# Patient Record
Sex: Female | Born: 1955
Health system: Southern US, Community
[De-identification: ages and names within clinical notes are randomized; demographics above are authoritative.]

## PROBLEM LIST (undated history)

## (undated) DIAGNOSIS — N852 Hypertrophy of uterus: Secondary | ICD-10-CM

## (undated) DIAGNOSIS — I10 Essential (primary) hypertension: Secondary | ICD-10-CM

## (undated) DIAGNOSIS — M1990 Unspecified osteoarthritis, unspecified site: Secondary | ICD-10-CM

## (undated) DIAGNOSIS — M8580 Other specified disorders of bone density and structure, unspecified site: Secondary | ICD-10-CM

## (undated) DIAGNOSIS — C801 Malignant (primary) neoplasm, unspecified: Secondary | ICD-10-CM

## (undated) DIAGNOSIS — E079 Disorder of thyroid, unspecified: Secondary | ICD-10-CM

## (undated) HISTORY — PX: THYROID LOBECTOMY: SHX420

## (undated) HISTORY — DX: Essential (primary) hypertension: I10

## (undated) HISTORY — DX: Other specified disorders of bone density and structure, unspecified site: M85.80

## (undated) HISTORY — DX: Malignant (primary) neoplasm, unspecified: C80.1

## (undated) HISTORY — DX: Unspecified osteoarthritis, unspecified site: M19.90

## (undated) HISTORY — DX: Hypertrophy of uterus: N85.2

## (undated) HISTORY — DX: Disorder of thyroid, unspecified: E07.9

---

## 2001-07-24 ENCOUNTER — Other Ambulatory Visit: Admission: RE | Admit: 2001-07-24 | Discharge: 2001-07-24 | Payer: Self-pay | Admitting: Obstetrics and Gynecology

## 2001-08-04 ENCOUNTER — Encounter (HOSPITAL_BASED_OUTPATIENT_CLINIC_OR_DEPARTMENT_OTHER): Payer: Self-pay | Admitting: General Surgery

## 2001-08-04 ENCOUNTER — Ambulatory Visit (HOSPITAL_COMMUNITY): Admission: RE | Admit: 2001-08-04 | Discharge: 2001-08-04 | Payer: Self-pay | Admitting: General Surgery

## 2001-08-07 ENCOUNTER — Encounter (HOSPITAL_BASED_OUTPATIENT_CLINIC_OR_DEPARTMENT_OTHER): Payer: Self-pay | Admitting: General Surgery

## 2001-08-07 ENCOUNTER — Ambulatory Visit (HOSPITAL_COMMUNITY): Admission: RE | Admit: 2001-08-07 | Discharge: 2001-08-07 | Payer: Self-pay | Admitting: General Surgery

## 2001-08-07 ENCOUNTER — Encounter (INDEPENDENT_AMBULATORY_CARE_PROVIDER_SITE_OTHER): Payer: Self-pay | Admitting: Specialist

## 2001-10-23 ENCOUNTER — Encounter (HOSPITAL_BASED_OUTPATIENT_CLINIC_OR_DEPARTMENT_OTHER): Payer: Self-pay | Admitting: General Surgery

## 2001-10-23 ENCOUNTER — Ambulatory Visit (HOSPITAL_COMMUNITY): Admission: RE | Admit: 2001-10-23 | Discharge: 2001-10-23 | Payer: Self-pay | Admitting: General Surgery

## 2001-10-23 ENCOUNTER — Encounter (INDEPENDENT_AMBULATORY_CARE_PROVIDER_SITE_OTHER): Payer: Self-pay | Admitting: Specialist

## 2001-11-24 ENCOUNTER — Encounter (HOSPITAL_BASED_OUTPATIENT_CLINIC_OR_DEPARTMENT_OTHER): Payer: Self-pay | Admitting: General Surgery

## 2001-11-29 ENCOUNTER — Encounter (INDEPENDENT_AMBULATORY_CARE_PROVIDER_SITE_OTHER): Payer: Self-pay | Admitting: Family Medicine

## 2001-11-29 ENCOUNTER — Encounter (INDEPENDENT_AMBULATORY_CARE_PROVIDER_SITE_OTHER): Payer: Self-pay | Admitting: *Deleted

## 2001-11-29 ENCOUNTER — Ambulatory Visit (HOSPITAL_COMMUNITY): Admission: RE | Admit: 2001-11-29 | Discharge: 2001-11-30 | Payer: Self-pay | Admitting: General Surgery

## 2001-11-29 DIAGNOSIS — D34 Benign neoplasm of thyroid gland: Secondary | ICD-10-CM | POA: Insufficient documentation

## 2002-07-30 ENCOUNTER — Other Ambulatory Visit: Admission: RE | Admit: 2002-07-30 | Discharge: 2002-07-30 | Payer: Self-pay | Admitting: Obstetrics and Gynecology

## 2002-09-07 ENCOUNTER — Ambulatory Visit (HOSPITAL_COMMUNITY): Admission: RE | Admit: 2002-09-07 | Discharge: 2002-09-07 | Payer: Self-pay | Admitting: Obstetrics and Gynecology

## 2003-07-27 DIAGNOSIS — I10 Essential (primary) hypertension: Secondary | ICD-10-CM | POA: Insufficient documentation

## 2004-10-21 ENCOUNTER — Encounter: Admission: RE | Admit: 2004-10-21 | Discharge: 2004-10-21 | Payer: Self-pay | Admitting: Internal Medicine

## 2006-06-19 ENCOUNTER — Emergency Department (HOSPITAL_COMMUNITY): Admission: EM | Admit: 2006-06-19 | Discharge: 2006-06-19 | Payer: Self-pay | Admitting: Family Medicine

## 2006-07-06 ENCOUNTER — Emergency Department (HOSPITAL_COMMUNITY): Admission: EM | Admit: 2006-07-06 | Discharge: 2006-07-06 | Payer: Self-pay | Admitting: Emergency Medicine

## 2008-03-10 ENCOUNTER — Emergency Department (HOSPITAL_COMMUNITY): Admission: EM | Admit: 2008-03-10 | Discharge: 2008-03-10 | Payer: Self-pay | Admitting: Family Medicine

## 2008-09-09 ENCOUNTER — Ambulatory Visit: Payer: Self-pay | Admitting: Family Medicine

## 2008-09-13 ENCOUNTER — Encounter (INDEPENDENT_AMBULATORY_CARE_PROVIDER_SITE_OTHER): Payer: Self-pay | Admitting: Family Medicine

## 2008-09-13 LAB — CONVERTED CEMR LAB
ALT: 63 units/L — ABNORMAL HIGH (ref 0–35)
AST: 34 units/L (ref 0–37)
Albumin: 4.8 g/dL (ref 3.5–5.2)
Alkaline Phosphatase: 56 units/L (ref 39–117)
BUN: 18 mg/dL (ref 6–23)
Basophils Absolute: 0 10*3/uL (ref 0.0–0.1)
Basophils Relative: 1 % (ref 0–1)
Calcium: 9.8 mg/dL (ref 8.4–10.5)
Chloride: 99 meq/L (ref 96–112)
Hep B E Ab: NEGATIVE
MCHC: 34 g/dL (ref 30.0–36.0)
Monocytes Relative: 7 % (ref 3–12)
Neutro Abs: 2.3 10*3/uL (ref 1.7–7.7)
Neutrophils Relative %: 52 % (ref 43–77)
Platelets: 274 10*3/uL (ref 150–400)
Potassium: 3.8 meq/L (ref 3.5–5.3)
RBC: 4.68 M/uL (ref 3.87–5.11)
Sodium: 141 meq/L (ref 135–145)
Total Protein: 7.7 g/dL (ref 6.0–8.3)
WBC: 4.3 10*3/uL (ref 4.0–10.5)

## 2008-09-17 ENCOUNTER — Encounter (INDEPENDENT_AMBULATORY_CARE_PROVIDER_SITE_OTHER): Payer: Self-pay | Admitting: Family Medicine

## 2008-12-20 ENCOUNTER — Encounter (INDEPENDENT_AMBULATORY_CARE_PROVIDER_SITE_OTHER): Payer: Self-pay | Admitting: Family Medicine

## 2008-12-20 ENCOUNTER — Ambulatory Visit: Payer: Self-pay | Admitting: Family Medicine

## 2008-12-20 DIAGNOSIS — K921 Melena: Secondary | ICD-10-CM | POA: Insufficient documentation

## 2008-12-20 DIAGNOSIS — E119 Type 2 diabetes mellitus without complications: Secondary | ICD-10-CM

## 2008-12-20 LAB — CONVERTED CEMR LAB
Bilirubin Urine: NEGATIVE
Blood in Urine, dipstick: NEGATIVE
Glucose, Urine, Semiquant: NEGATIVE
Ketones, urine, test strip: NEGATIVE
Nitrite: NEGATIVE
Protein, U semiquant: NEGATIVE
Specific Gravity, Urine: 1.02
Urobilinogen, UA: 0.2
WBC Urine, dipstick: NEGATIVE
pH: 7

## 2008-12-25 ENCOUNTER — Encounter (INDEPENDENT_AMBULATORY_CARE_PROVIDER_SITE_OTHER): Payer: Self-pay | Admitting: Family Medicine

## 2008-12-26 ENCOUNTER — Ambulatory Visit (HOSPITAL_COMMUNITY): Admission: RE | Admit: 2008-12-26 | Discharge: 2008-12-26 | Payer: Self-pay | Admitting: Family Medicine

## 2009-03-20 ENCOUNTER — Encounter (INDEPENDENT_AMBULATORY_CARE_PROVIDER_SITE_OTHER): Payer: Self-pay | Admitting: *Deleted

## 2010-03-05 ENCOUNTER — Ambulatory Visit: Payer: Self-pay | Admitting: Physician Assistant

## 2010-03-05 DIAGNOSIS — N390 Urinary tract infection, site not specified: Secondary | ICD-10-CM | POA: Insufficient documentation

## 2010-03-05 LAB — CONVERTED CEMR LAB
Bilirubin Urine: NEGATIVE
Glucose, Urine, Semiquant: NEGATIVE
Hgb A1c MFr Bld: 6.4 %
Ketones, urine, test strip: NEGATIVE
Protein, U semiquant: NEGATIVE
Specific Gravity, Urine: 1.015
Urobilinogen, UA: 0.2

## 2010-03-06 ENCOUNTER — Telehealth: Payer: Self-pay | Admitting: Physician Assistant

## 2010-03-06 ENCOUNTER — Encounter: Payer: Self-pay | Admitting: Physician Assistant

## 2010-03-06 LAB — CONVERTED CEMR LAB
Albumin: 4.8 g/dL (ref 3.5–5.2)
BUN: 14 mg/dL (ref 6–23)
CO2: 28 meq/L (ref 19–32)
Calcium: 9.9 mg/dL (ref 8.4–10.5)
Cholesterol, target level: 200 mg/dL
Cholesterol: 140 mg/dL (ref 0–200)
Eosinophils Absolute: 0.1 10*3/uL (ref 0.0–0.7)
Eosinophils Relative: 3 % (ref 0–5)
Glucose, Bld: 98 mg/dL (ref 70–99)
HCT: 42.3 % (ref 36.0–46.0)
HDL goal, serum: 40 mg/dL
HDL: 51 mg/dL (ref >39)
LDL Goal: 100 mg/dL
Lymphocytes Relative: 41 % (ref 12–46)
Lymphs Abs: 1.8 10*3/uL (ref 0.7–4.0)
MCV: 90.6 fL (ref 78.0–100.0)
Monocytes Relative: 6 % (ref 3–12)
Neutrophils Relative %: 49 % (ref 43–77)
Platelets: 318 10*3/uL (ref 150–400)
Potassium: 4.5 meq/L (ref 3.5–5.3)
RBC: 4.67 M/uL (ref 3.87–5.11)
Sodium: 141 meq/L (ref 135–145)
Total Protein: 7.3 g/dL (ref 6.0–8.3)
Triglycerides: 175 mg/dL — ABNORMAL HIGH (ref <150)
WBC: 4.3 10*3/uL (ref 4.0–10.5)

## 2010-03-17 ENCOUNTER — Telehealth: Payer: Self-pay | Admitting: Physician Assistant

## 2010-03-23 ENCOUNTER — Encounter: Payer: Self-pay | Admitting: Physician Assistant

## 2010-03-27 ENCOUNTER — Telehealth: Payer: Self-pay | Admitting: Physician Assistant

## 2010-04-08 ENCOUNTER — Ambulatory Visit (HOSPITAL_COMMUNITY): Admission: RE | Admit: 2010-04-08 | Discharge: 2010-04-08 | Payer: Self-pay | Admitting: Gastroenterology

## 2010-04-11 ENCOUNTER — Inpatient Hospital Stay (HOSPITAL_COMMUNITY)
Admission: EM | Admit: 2010-04-11 | Discharge: 2010-04-14 | Payer: Self-pay | Source: Home / Self Care | Admitting: Internal Medicine

## 2010-04-22 ENCOUNTER — Encounter: Payer: Self-pay | Admitting: Physician Assistant

## 2010-04-24 ENCOUNTER — Encounter: Payer: Self-pay | Admitting: Physician Assistant

## 2010-04-24 DIAGNOSIS — Z860100 Personal history of colon polyps, unspecified: Secondary | ICD-10-CM | POA: Insufficient documentation

## 2010-04-28 ENCOUNTER — Ambulatory Visit (HOSPITAL_COMMUNITY): Admission: RE | Admit: 2010-04-28 | Discharge: 2010-04-28 | Payer: Self-pay | Admitting: Gastroenterology

## 2010-07-10 ENCOUNTER — Ambulatory Visit: Payer: Self-pay | Admitting: Physician Assistant

## 2010-07-23 DIAGNOSIS — L84 Corns and callosities: Secondary | ICD-10-CM

## 2010-07-23 LAB — CONVERTED CEMR LAB
Blood Glucose, Fingerstick: 119
Hgb A1c MFr Bld: 6.2 %

## 2010-07-26 HISTORY — PX: TOTAL THYROIDECTOMY: SHX2547

## 2010-07-30 ENCOUNTER — Ambulatory Visit (HOSPITAL_COMMUNITY): Admission: RE | Admit: 2010-07-30 | Payer: Self-pay | Source: Home / Self Care | Admitting: Internal Medicine

## 2010-08-16 ENCOUNTER — Encounter: Payer: Self-pay | Admitting: Internal Medicine

## 2010-08-25 NOTE — Miscellaneous (Signed)
  Clinical Lists Changes  Problems: Added new problem of COLONIC POLYPS, HX OF (ICD-V12.72) Observations: Added new observation of PAST MED HX: Hypertension (07/27/2003) Colonic polyps, hx of   a.  colo 9.14.2011 . . . polyp removed   b.  post polypectomy bleed . . admx to hosp with lower GI bleed   c.  repeat sigmoidoscopy planned Oct 2011   d.  prob will need rpt colo 2014 (04/24/2010 15:42) Added new observation of PMH COLONPYP: yes (04/24/2010 15:42)       Past History:  Past Medical History: Hypertension (07/27/2003) Colonic polyps, hx of   a.  colo 9.14.2011 . . . polyp removed   b.  post polypectomy bleed . . admx to hosp with lower GI bleed   c.  repeat sigmoidoscopy planned Oct 2011   d.  prob will need rpt colo 2014

## 2010-08-25 NOTE — Letter (Signed)
Summary: Lipid Letter  [facility]  [address] [place], [place] [address]   Phone: [phone]  Fax: [phone]    03/06/2010  Jamie [name] [address] [place], [place]  [address]  Dear Laurence Compton:  We have carefully reviewed your last lipid profile from 03/05/2010 and the results are noted below with a summary of recommendations for lipid management.    Cholesterol:       140     Goal: <200   HDL "good" Cholesterol:   51     Goal: >40   LDL "bad" Cholesterol:   54     Goal: <100   Triglycerides:     175     Goal: <150    TLC Diet (Therapeutic Lifestyle Change): Saturated Fats & Transfatty acids should be kept < 7% of total calories ***Reduce Saturated Fats Polyunstaurated Fat can be up to 10% of total calories Monounsaturated Fat Fat can be up to 20% of total calories Total Fat should be no greater than 25-35% of total calories Carbohydrates should be 50-60% of total calories Protein should be approximately 15% of total calories Fiber should be at least 20-30 grams a day ***Increased fiber may help lower LDL Total Cholesterol should be < 200mg /day Consider adding plant stanol/sterols to diet (example: Benacol spread) ***A higher intake of unsaturated fat may reduce Triglycerides and Increase HDL    Adjunctive Measures (may lower LIPIDS and reduce risk of Heart Attack) include: Aerobic Exercise (20-30 minutes 3-4 times a week) Limit Alcohol Consumption Weight Reduction Dietary Fiber 20-30 grams a day by mouth    Current Medications: 1)    Maxzide-25 37.5-25 Mg Tabs (Triamterene-hctz) .... Take 1 tablet by mouth every morning for blood pressure 2)    Ciprofloxacin Hcl 500 Mg Tabs (Ciprofloxacin hcl) .... Take 1 tablet by mouth two times a day  If you have any questions, please call.   Sincerely,   Tereso Newcomer PA-C

## 2010-08-25 NOTE — Progress Notes (Signed)
Summary: Colonoscopy  Phone Note Outgoing Call   Summary of Call: Patient to be set up for colo with Dr. Ewing Schlein. Notes say he needs some labs. Please contact his office and find out what he needs.  Initial call taken by: Tereso Newcomer PA-C,  March 27, 2010 4:33 PM  Follow-up for Phone Call        the nurse was contacted and she will call me back with the wanted information Follow-up by: Armenia Shannon,  April 02, 2010 11:03 AM

## 2010-08-25 NOTE — Letter (Signed)
Summary: [facility]CONS[facility]  [facility]   Imported By: Arta Bruce 05/01/2010 15:08:18  _____________________________________________________________________  External Attachment:    Type:   Image     Comment:   External Document

## 2010-08-25 NOTE — Assessment & Plan Note (Signed)
Summary: Jamie [name] PT//HTN////KT   Vital Signs:  Patient profile:   55 year old female Menstrual status:  postmenopausal Height:      59.5 inches Weight:      124 pounds BMI:     24.71 Temp:     97.8 degrees F oral Pulse rate:   88 / minute Pulse rhythm:   regular Resp:     16 per minute BP sitting:   108 / 74  (left arm) Cuff size:   regular  Vitals Entered By: Armenia Shannon (March 05, 2010 8:57 AM) CC: needs refills on bp meds, Hypertension Management Is Patient Diabetic? Yes CBG Result 104  Does patient need assistance? Ambulation Normal   Primary Care Provider:  Tereso Newcomer PA-C  CC:  needs refills on bp meds and Hypertension Management.  History of Present Illness: Here for f/u.  Not seen in a long time. Previous patient of Dr. Barbaraann [name]. Here with daughter who interprets. Notes back pain for last 2-3 days.  No injury.  Feels some pain in her buttock.  No pain down leg or in foot.  No fevers.  No loss of bowel or bladder control.  Advil did help.  Hypertension History:      She denies headache, chest pain, dyspnea with exertion, syncope, and side effects from treatment.        Positive major cardiovascular risk factors include diabetes and hypertension.  Negative major cardiovascular risk factors include female age less than 5 years old and non-tobacco-user status.      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 Take 1 Tablet By Mouth Every Morning For Blood Pressure  Allergies (verified): No Known Drug Allergies  Past History:  Past Medical  History: Reviewed history from 09/09/2008 and no changes required. Hypertension (07/27/2003)  Family History: Reviewed history from 12/20/2008 and no changes required. Mother deceased age  ~40...DM,Heart problems. Father deceased age 52.Marland Kitchen?Marland KitchenLung cancer  No family history of breast cancer. 1 sister living ?womb cancer  Social History: Reviewed history from 09/09/2008 and no changes required. 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 Never Smoked Alcohol use-no Drug use-no  Review of Systems GU:  Denies dysuria, hematuria, and urinary frequency.  Physical Exam  General:  alert, well-developed, and well-nourished.   Head:  normocephalic and atraumatic.   Eyes:  pupils equal, pupils round, pupils reactive to light, and no optic disk abnormalities.   Neck:  supple.   Lungs:  normal breath sounds.   Heart:  normal rate and regular rhythm.   Abdomen:  soft, non-tender, and no hepatomegaly.   Msk:  + pain over paraspinal area on right neg SLR bilat no tend with CVA percussion  Neurologic:  alert & oriented X3 and cranial nerves II-XII intact.   BLE strength normal and equal patellar and achilles DTRs 2+ bilat  Psych:  normally interactive.     Impression & Recommendations:  Problem # 1:  EXAMINATION, ROUTINE MEDICAL (ICD-V70.0)  needs screening colo . . .never had also needs mammo  pap done last year  Orders: T-Lipid Profile [phone]) Gastroenterology Referral (GI) T-Hemoccult Cards-Multiple [phone])  Mammogram (Screening) (Mammo)  Problem # 2:  BLOOD IN STOOL (ICD-578.1)  h/o this in past never had colo reports with hard bm's on top of stool small amount set up for screening colo check cbc  Orders: T-CBC w/Diff ([id number]) Gastroenterology Referral (GI) T-Hemoccult Cards-Multiple (91478)  Problem # 3:  HYPERTENSION (ICD-401.9) controlled  Her updated medication list for this problem includes:    Maxzide-25 37.5-25 Mg Tabs  (Triamterene-hctz) .Marland Kitchen... Take 1 tablet by mouth every morning for blood pressure  Orders: T-Lipid Profile ([id number]) T-Comprehensive Metabolic Panel ([id number]) UA Dipstick w/o Micro (manual) (28413)  Problem # 4:  DIABETES MELLITUS, MILD (ICD-250.00) has eye dr. goes every year  Orders: T- Hemoglobin A1C ([address]) T-Urine Microalbumin w/creat. ratio [id number])  Problem # 5:  UTI (ICD-599.0)  no dysuria ? if back pain related will tx with cipro x 3 days cx urine nsaids f/u as needed    Her updated medication list for this problem includes:    Ciprofloxacin Hcl 500 Mg Tabs (Ciprofloxacin hcl) .Marland Kitchen... Take 1 tablet by mouth two times a day  Complete Medication List: 1)  Maxzide-25 37.5-25 Mg Tabs (Triamterene-hctz) .... Take 1 tablet by mouth every morning for blood pressure 2)  Ciprofloxacin Hcl 500 Mg Tabs (Ciprofloxacin hcl) .... Take 1 tablet by mouth two times a day  Other Orders: T-TSH ([id number]) T-Culture, Urine ([id number])  Hypertension Assessment/Plan:      The patient's hypertensive risk group is category C: Target organ damage and/or diabetes.  Today's blood pressure is 108/74.  Her blood pressure goal is < 130/80.  Patient Instructions: 1)  Have your eye doctor fax me a note after he/she sees you. 2)  Take antibiotics (cipro) until all gone.  I faxed it to your pharmacy today.  Pick it up today. 3)  Use Aleve 2 tabs by mouth two times a day for 3-4 days. 4)  Use heat on back two times a day to three times a day as needed. 5)  If back pain does not resolve with above treatment or gets worse, arrange follow up. 6)  Please schedule a follow-up appointment in 3 months with Scott for diabetes and blood pressure.  Prescriptions: CIPROFLOXACIN HCL 500 MG TABS (CIPROFLOXACIN HCL) Take 1 tablet by mouth two times a day  #6 x 0   Entered and Authorized by:   Tereso Newcomer PA-C   Signed by:   Tereso Newcomer PA-C on 03/05/2010   Method used:    Electronically to        [facility] (retail)       [address]       [place], [place]  [address]       Ph: [phone]       Fax: [phone]   RxID:   [id number] MAXZIDE-25 37.5-25 MG TABS (TRIAMTERENE-HCTZ) Take 1 tablet by mouth every morning for blood pressure  #30 Each x 5   Entered and Authorized by:   Tereso Newcomer PA-C   Signed by:   Tereso Newcomer PA-C on 03/05/2010   Method used:   Electronically to        [facility] (retail)       [address]       [place], [place]  [address]       Ph: [phone]       Fax: [phone]   RxID:   [id number]  Laboratory Results   Urine Tests    Routine Urinalysis   Glucose: negative   (Normal Range: Negative) Bilirubin: negative   (Normal Range: Negative) Ketone: negative   (Normal Range: Negative) Spec. Gravity: 1.015   (Normal Range: 1.003-1.035) Blood: negative   (Normal Range: Negative) pH: 6.0   (Normal Range: 5.0-8.0) Protein: negative   (Normal Range: Negative) Urobilinogen: 0.2   (Normal Range: 0-1) Nitrite: negative   (Normal Range: Negative) Leukocyte Esterace: trace   (Normal Range: Negative)     Blood Tests   Date/Time Received: March 05, 2010 9:41 AM   HGBA1C: 6.4%   (Normal Range: Non-Diabetic - 3-6%   Control Diabetic - 6-8%) CBG Random:: 104mg /dL      Appended Document: RANKINS PT//HTN////KT     Allergies: No Known Drug Allergies   Complete Medication List: 1)  Maxzide-25 37.5-25 Mg Tabs (Triamterene-hctz) .... Take 1 tablet by mouth every morning for blood pressure   Laboratory Results  Date/Time Received: March 12, 2010 4:38 PM   Stool - Occult Blood Hemmoccult #1: positive Date: 03/12/2010 Hemoccult #2: positive Date: 03/12/2010 Hemoccult #3: positive Date: 03/12/2010

## 2010-08-25 NOTE — Progress Notes (Signed)
Summary: Needs to see GI; stool for blood + x 3  Phone Note Outgoing Call   Summary of Call: Stool is positive for blood. She is not anemic which is good. But, I need her to get in to see gastroenterology soon.  Make sure she is getting an appointment. Please send a copy of her hemoccult results and labs to the GI seeing her.  Initial call taken by: Brynda Rim,  March 17, 2010 5:46 PM  Follow-up for Phone Call        pt is aware.Marland KitchenMarland KitchenMarland KitchenArmenia [name]  March 18, 2010 10:22 AM

## 2010-08-25 NOTE — Progress Notes (Signed)
Summary: Lab f/u  Phone Note Outgoing Call   Summary of Call: Blood counts, liver, kidney and thyroid function normal. Her cholesterol is good except for triglycerides. Send lipid letter. Repeat FLP and LFTs in 6 mos.  Initial call taken by: Brynda Rim,  March 06, 2010 9:23 AM  Follow-up for Phone Call        pt is aware Follow-up by: Arm[place] Shannon,  March 09, 2010 3:00 PM

## 2010-08-25 NOTE — Consult Note (Signed)
Summary: [facility]/DR.MARC MAG[facility]  [facility]/DR.MARC [facility]   Imported By: Arta Bruce 04/02/2010 14:30:57  _____________________________________________________________________  External Attachment:    Type:   Image     Comment:   External Document

## 2010-08-26 ENCOUNTER — Encounter: Payer: Self-pay | Admitting: Internal Medicine

## 2010-08-27 NOTE — Assessment & Plan Note (Signed)
Summary: FU IN 3 MOS DM AND BP//MC   Vital Signs:  Patient profile:   55 year old female Menstrual status:  postmenopausal Weight:      125.50 pounds Temp:     97 degrees F oral Pulse rate:   76 / minute Pulse rhythm:   regular Resp:     16 per minute BP sitting:   120 / 84  (left arm) Cuff size:   regular  Vitals Entered By: Hale Drone CMA (July 23, 2010 12:06 PM) CC: 3 month f/u on DM and BP from 03/05/10.  Is Patient Diabetic? Yes Pain Assessment Patient in pain? no      CBG Result 119 CBG Device ID B Fasting  Does patient need assistance? Functional Status Self care Ambulation Normal   Primary Care Provider:  Tereso Newcomer PA-C  CC:  3 month f/u on DM and BP from 03/05/10. Marland Kitchen  History of Present Illness: 1.  Dysplastic colon polyp at 25 cm:  as per Dr. Ewing Schlein.  Repeat scope (flex sig) with removal of polyp stalk did not show any further dysplasia in stalk.  Daughter states she will follow up with Dr. Ewing Schlein in 3 years.  2. Hypertension:  Takes medication regularly.    3.  DM:  diet controlled.  Does walk on treadmill daily, does housework.  Diet is fairly stable.  Cholesterol is  basically at goal, triglycerides a bit high.  Has not had eye exam in some time.  Does not check feet every night.  Would like to take fish oil capsules--wants to know if okay to take.    4.  Bad dry skin on bottoms of feet.  No itching.  Allergies: No Known Drug Allergies  Social History: [place] Came to [employer]. 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 Never Smoked Alcohol use-no Drug use-no  Physical Exam  Lungs:  Normal respiratory effort, chest expands symmetrically. Lungs are clear to auscultation, no crackles or wheezes. Heart:  Normal rate and regular rhythm. S1 and S2 normal without gallop, murmur, click, rub or other extra sounds.  Radial pulses normal and equal Extremities:  Very dry with mild flaking around edges of feet--more prononunced at posterior  heel.  Mild callous formation.   Impression & Recommendations:  Problem # 1:  DIABETES MELLITUS, MILD (ICD-250.00) Controlled SChedule REtasure To check feet daily and perform daily footcare with mild callous formation Cholesterol almost at goal--did encourage fish oil capsules 1200 mg twice daily Orders: Capillary Blood Glucose/CBG (95621) Hemoglobin A1C (30865)  Problem # 2:  HYPERTENSION (ICD-401.9) Controlled Her updated medication list for this problem includes:    Maxzide-25 37.5-25 Mg Tabs (Triamterene-hctz) .Marland Kitchen... 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 Ewing Schlein  Problem # 4:  Preventive Health Care (ICD-V70.0) Mammogram and set up for CPP  Complete Medication List: 1)  Maxzide-25 37.5-25 Mg Tabs (Triamterene-hctz) .... Take 1 tablet by mouth every morning for blood pressure 2)  Fish Oil 1200 Mg Caps (Omega-3 fatty acids) .Marland Kitchen.. 1 by mouth two times a day --otc  Other Orders: Flu Vaccine 55yrs + (78469) Admin 1st Vaccine (62952) Mammogram (Screening) (Mammo)  Patient Instructions: 1)  Schedule Retasure 2)  Tea Tree Oil lotion or cream--use after washing and drying feet well two times a day  3)  Use gentle pumice stone to smooth back of heel. 4)  Follow up with Dr. Delrae Alfred in 4 months --CPP   Orders Added: 1)  Capillary Blood Glucose/CBG [82948] 2)  Flu Vaccine 81yrs + [90658] 3)  Admin 1st Vaccine [90471] 4)  Hemoglobin A1C [83036] 5)  Mammogram (Screening) [Mammo] 6)  Est. Patient Level IV [16109]   Immunizations Administered:  Influenza Vaccine # 1:    Vaccine Type: Fluvax 3+    Site: left deltoid    Mfr: GlaxoSmithKline    Dose: 0.5 ml    Route: IM    Given by: Hale Drone CMA    Exp. Date: 01/23/2011    Lot #: [id number]    VIS given: 02/17/10 version given July 23, 2010.  Flu Vaccine Consent Questions:    Do  you have a history of severe allergic reactions to this vaccine? no    Any prior history of allergic reactions to egg and/or gelatin? no    Do you have a sensitivity to the preservative Thimersol? no    Do you have a past history of Guillan-Barre Syndrome? no    Do you currently have an acute febrile illness? no    Have you ever had a severe reaction to latex? no    Vaccine information given and explained to patient? yes    Are you currently pregnant? no   Immunizations Administered:  Influenza Vaccine # 1:    Vaccine Type: Fluvax 3+    Site: left deltoid    Mfr: GlaxoSmithKline    Dose: 0.5 ml    Route: IM    Given by: Hale Drone CMA    Exp. Date: 01/23/2011    Lot #: [id number]    VIS given: 02/17/10 version given July 23, 2010.   Diabetic Foot Exam Last Podiatry Exam Date: 07/23/2010  Foot Inspection Is there a history of a foot ulcer?              No Is there a foot ulcer now?              No Can the patient see the bottom of their feet?          Yes Are the shoes appropriate in style and fit?          Yes Is there swelling or an abnormal foot shape?          No Are the toenails long?                No Are the toenails thick?                No Are the toenails ingrown?              No Is there heavy callous build-up?              Yes Is there pain in the calf muscle (Intermittent claudication) when walking?    NoIs there a claw toe deformity?              No Is there elevated skin temperature?            No Is there limited ankle dorsiflexion?            No Is there foot or ankle muscle weakness?            No  Diabetic Foot Care Education  High Risk Feet? No    Laboratory Results   Blood Tests   Date/Time Received: July 23, 2010 12:22 PM   HGBA1C: 6.2%   (  Normal Range: Non-Diabetic - 3-6%   Control Diabetic - 6-8%) CBG Random:: 119mg /dL

## 2010-10-05 ENCOUNTER — Telehealth (INDEPENDENT_AMBULATORY_CARE_PROVIDER_SITE_OTHER): Payer: Self-pay | Admitting: Internal Medicine

## 2010-10-06 ENCOUNTER — Encounter: Payer: Self-pay | Admitting: Nurse Practitioner

## 2010-10-06 ENCOUNTER — Encounter (INDEPENDENT_AMBULATORY_CARE_PROVIDER_SITE_OTHER): Payer: Self-pay | Admitting: Nurse Practitioner

## 2010-10-06 DIAGNOSIS — K219 Gastro-esophageal reflux disease without esophagitis: Secondary | ICD-10-CM | POA: Insufficient documentation

## 2010-10-06 LAB — CONVERTED CEMR LAB
Basophils Absolute: 0 10*3/uL (ref 0.0–0.1)
Basophils Relative: 0 % (ref 0–1)
Blood in Urine, dipstick: NEGATIVE
Eosinophils Relative: 2 % (ref 0–5)
HCT: 42.3 % (ref 36.0–46.0)
Hemoglobin: 14.1 g/dL (ref 12.0–15.0)
MCHC: 33.3 g/dL (ref 30.0–36.0)
Monocytes Absolute: 0.4 10*3/uL (ref 0.1–1.0)
Monocytes Relative: 5 % (ref 3–12)
Nitrite: NEGATIVE
Protein, U semiquant: NEGATIVE
RBC: 4.73 M/uL (ref 3.87–5.11)
RDW: 13.3 % (ref 11.5–15.5)
Urobilinogen, UA: 0.2

## 2010-10-08 LAB — COMPREHENSIVE METABOLIC PANEL
Alkaline Phosphatase: 46 U/L (ref 39–117)
BUN: 12 mg/dL (ref 6–23)
Chloride: 103 mEq/L (ref 96–112)
GFR calc non Af Amer: >60 mL/min (ref >60)
Glucose, Bld: 111 mg/dL — ABNORMAL HIGH (ref 70–99)
Potassium: 3.6 mEq/L (ref 3.5–5.1)
Total Bilirubin: 0.8 mg/dL (ref 0.3–1.2)

## 2010-10-08 LAB — URINALYSIS, ROUTINE W REFLEX MICROSCOPIC
Ketones, ur: NEGATIVE mg/dL
Nitrite: NEGATIVE
Protein, ur: NEGATIVE mg/dL
pH: 7 (ref 5.0–8.0)

## 2010-10-08 LAB — POCT I-STAT, CHEM 8
Calcium, Ion: 1.19 mmol/L (ref 1.12–1.32)
Chloride: 97 mEq/L (ref 96–112)
HCT: 42 % (ref 36.0–46.0)
Sodium: 138 mEq/L (ref 135–145)

## 2010-10-08 LAB — BASIC METABOLIC PANEL
BUN: 7 mg/dL (ref 6–23)
CO2: 29 mEq/L (ref 19–32)
Chloride: 105 mEq/L (ref 96–112)
Creatinine, Ser: 0.66 mg/dL (ref 0.4–1.2)
GFR calc Af Amer: >60 mL/min (ref >60)
GFR calc non Af Amer: >60 mL/min (ref >60)
Glucose, Bld: 88 mg/dL (ref 70–99)
Potassium: 3.4 mEq/L — ABNORMAL LOW (ref 3.5–5.1)
Potassium: 3.9 mEq/L (ref 3.5–5.1)
Sodium: 138 mEq/L (ref 135–145)

## 2010-10-08 LAB — CBC
HCT: 29 % — ABNORMAL LOW (ref 36.0–46.0)
HCT: 29.9 % — ABNORMAL LOW (ref 36.0–46.0)
HCT: 31.3 % — ABNORMAL LOW (ref 36.0–46.0)
HCT: 33.1 % — ABNORMAL LOW (ref 36.0–46.0)
HCT: 39.3 % (ref 36.0–46.0)
Hemoglobin: 10.1 g/dL — ABNORMAL LOW (ref 12.0–15.0)
MCH: 28.9 pg (ref 26.0–34.0)
MCHC: 33.8 g/dL (ref 30.0–36.0)
MCHC: 34.5 g/dL (ref 30.0–36.0)
MCHC: 35.1 g/dL (ref 30.0–36.0)
MCV: 84.2 fL (ref 78.0–100.0)
MCV: 85.7 fL (ref 78.0–100.0)
Platelets: 294 10*3/uL (ref 150–400)
RBC: 3.49 MIL/uL — ABNORMAL LOW (ref 3.87–5.11)
RBC: 3.93 MIL/uL (ref 3.87–5.11)
RDW: 12.4 % (ref 11.5–15.5)
RDW: 12.6 % (ref 11.5–15.5)
RDW: 12.6 % (ref 11.5–15.5)
WBC: 5.2 10*3/uL (ref 4.0–10.5)
WBC: 5.4 10*3/uL (ref 4.0–10.5)

## 2010-10-08 LAB — CROSSMATCH: ABO/RH(D): O POS

## 2010-10-08 LAB — DIFFERENTIAL
Basophils Absolute: 0 10*3/uL (ref 0.0–0.1)
Basophils Relative: 1 % (ref 0–1)
Monocytes Absolute: 0.3 10*3/uL (ref 0.1–1.0)
Neutro Abs: 2.9 10*3/uL (ref 1.7–7.7)
Neutrophils Relative %: 56 % (ref 43–77)

## 2010-10-08 LAB — PROTIME-INR: INR: 1.06 (ref 0.00–1.49)

## 2010-10-08 LAB — HEMOGLOBIN AND HEMATOCRIT, BLOOD
HCT: 30.2 % — ABNORMAL LOW (ref 36.0–46.0)
HCT: 34.7 % — ABNORMAL LOW (ref 36.0–46.0)
Hemoglobin: 10.3 g/dL — ABNORMAL LOW (ref 12.0–15.0)
Hemoglobin: 12.1 g/dL (ref 12.0–15.0)

## 2010-10-09 ENCOUNTER — Encounter (INDEPENDENT_AMBULATORY_CARE_PROVIDER_SITE_OTHER): Payer: Self-pay | Admitting: Nurse Practitioner

## 2010-10-09 ENCOUNTER — Encounter: Payer: Self-pay | Admitting: Nurse Practitioner

## 2010-10-13 NOTE — Assessment & Plan Note (Signed)
Summary: F/u Abd pain   Vital Signs:  Patient profile:   55 year old female Menstrual status:  postmenopausal Weight:      124.06 pounds Temp:     98.6 degrees F oral Pulse rate:   80 / minute Pulse rhythm:   regular Resp:     20 per minute BP sitting:   129 / 76  (left arm) Cuff size:   regular  Vitals Entered By: Hale Drone CMA (October 09, 2010 9:00 AM) CC: f/u on abd. pain... per interpreter, who is daughter, states that she is feeling much better since being on ranitidine. , Abdominal Pain Is Patient Diabetic? No Pain Assessment Patient in pain? no       Does patient need assistance? Functional Status Self care Ambulation Normal   CC:  f/u on abd. pain... per interpreter, who is daughter, states that she is feeling much better since being on ranitidine. , and Abdominal Pain.  History of Present Illness:  Pt into the office to f/u on abdominal pain. CBC done earlier this week was normal. U/a done on last visit was normal. Pt was started on ranitidine 150mg  by mouth two times a day of which she purchased from [facility] [place]. Pt reports that symptoms have improved. She has also been taking citrucel OTC and bowels have been doing much better.  Daughter present today with pt who is interpreting for her.  Dyspepsia History:      The patient notes that the symptoms began approximately 09/24/2010.  She has no alarm features of dyspepsia including no history of melena, hematochezia, dysphagia, persistent vomiting, or involuntary weight loss > 5%.  There is no prior history of GERD.  The patient does not have a prior history of documented ulcer disease.  The dominant symptom is heartburn or acid reflux.  An H-2 blocker medication is currently being taken.  She notes that the symptoms have improved with the H-2 blocker therapy.     Current Medications (verified): 1)  Maxzide-25 37.5-25 Mg Tabs (Triamterene-Hctz) .... Take 1 Tablet By Mouth Every Morning For Blood Pressure 2)  Fish  Oil 1200 Mg Caps (Omega-3 Fatty Acids) .Marland Kitchen.. 1 By Mouth Two Times A Day --Otc 3)  Ranitidine Hcl 150 Mg Tabs (Ranitidine Hcl) .... One Tablet By Mouth Two Times A Day - Before Breakfast and Before Bedtime Dinner  Allergies (verified): No Known Drug Allergies  Review of Systems General:  Denies fever. CV:  Denies chest pain or discomfort. Resp:  Denies cough. GI:  Denies abdominal pain, nausea, and vomiting.  Physical Exam  General:  alert.   Head:  normocephalic.   Abdomen:  soft and non-tender.   Msk:  normal ROM.   Neurologic:  alert & oriented X3.   Skin:  color normal.   Psych:  Oriented X3.     Impression & Recommendations:  Problem # 1:  GERD (ICD-530.81) symptoms improved pt to keep taking meds, avoid foods as reviewed during last visit  Her updated medication list for this problem includes:    Ranitidine Hcl 150 Mg Tabs (Ranitidine hcl) ..... One tablet by mouth two times a day - before breakfast and before bedtime dinner  Complete Medication List: 1)  Maxzide-25 37.5-25 Mg Tabs (Triamterene-hctz) .... Take 1 tablet by mouth every morning for blood pressure 2)  Fish Oil 1200 Mg Caps (Omega-3 fatty acids) .Marland Kitchen.. 1 by mouth two times a day --otc 3)  Ranitidine Hcl 150 Mg Tabs (Ranitidine hcl) .... One tablet by mouth  two times a day - before breakfast and before bedtime dinner  Dyspepsia Assessment/Plan:  Step Therapy: GERD Treatment Protocols:    Step-1: improved  Patient Instructions: 1)  Lab work done during last visit was normal. 2)  Urine was ok. 3)  Keep taking the ranitidine 150mg  by mouth two times a day for at least 2 weeks. After then you can try to take only once daily and see how your symptoms are doing.  There are refills so if you need more of the medication then go to the pharmacy. 4)  Take the citrucel every other day to make sure your bowels stay soft and regular. 5)  Keep next appointment in April with Dr. Delrae Alfred for blood sugar and blood  pressure   Orders Added: 1)  Est. Patient Level III [81191]

## 2010-10-13 NOTE — Assessment & Plan Note (Signed)
Summary: Acute - Abdominal pain   Vital Signs:  Patient profile:   56 year old female Menstrual status:  postmenopausal Weight:      123.9 pounds BMI:     24.69 Temp:     98.3 degrees F oral Pulse rate:   82 / minute Pulse rhythm:   regular Resp:     20 per minute BP sitting:   123 / 84  (left arm) Cuff size:   regular  Vitals Entered By: Levon Hedger (October 06, 2010 11:33 AM) CC: stomach pain since last Thursday with pain radiating around to the back it feel like knots ., Abdominal Pain Is Patient Diabetic? No Pain Assessment Patient in pain? yes     Location: stomach Intensity: 3 Type: knots  Does patient need assistance? Functional Status Self care Ambulation Normal   Primary Care Provider:  Tereso Newcomer PA-C  CC:  stomach pain since last Thursday with pain radiating around to the back it feel like knots . and Abdominal Pain.  History of Present Illness:  Pt into the office for an acute visit - abdominal pain       This is a 55 year old woman who presents with Abdominal Pain.  The symptoms began 5 days ago.  On a scale of 1 to 10, the intensity is described as a 5.  reports normal Bowel movement event htough ah history of constipation. pt did take some citrucel on yesterday without resolution of the symptoms.  The patient denies nausea, vomiting, diarrhea, and constipation.  The location of the pain is epigastric.  The patient denies the following symptoms: fever.   "I feel something twisting in my stomach" No spicy  No tomato sauce or foods 1 cup of coffee per day no tobacco  no ETOH  Family present during the exam.  Allergies (verified): No Known Drug Allergies  Review of Systems General:  Denies fever. CV:  Denies chest pain or discomfort. Resp:  Denies coughing up blood. GI:  Complains of abdominal pain; denies constipation, nausea, and vomiting.  Physical Exam  General:  alert.   Head:  normocephalic.   Lungs:  normal breath sounds.   Heart:   normal rate and regular rhythm.   Abdomen:  Decreased bowel sounds  right lower quad tenderness Msk:  normal ROM.   Neurologic:  alert & oriented X3.     Impression & Recommendations:  Problem # 1:  ABDOMINAL PAIN (ICD-789.00) will check u/a today advise daughter to continue citrucel right lower quad tenderness - but no fever or n/v.  Will check cbc midepigastric pain is usual source so will give ranitidine Orders: T-CBC w/Diff ([id number]) UA Dipstick w/o Micro (manual) (09811)  Complete Medication List: 1)  Maxzide-25 37.5-25 Mg Tabs (Triamterene-hctz) .... Take 1 tablet by mouth every morning for blood pressure 2)  Fish Oil 1200 Mg Caps (Omega-3 fatty acids) .Marland Kitchen.. 1 by mouth two times a day --otc 3)  Ranitidine Hcl 150 Mg Tabs (Ranitidine hcl) .... One tablet by mouth two times a day - before breakfast and before bedtime dinner  Patient Instructions: 1)  Your labs will be checked today for infecton 2)  Citrucel - You already have this at home.  Take when you get home today (Tuesday), Take a dose on Wednesday and a dose on Thursday. 3)  Start medication for stomach - ranitidine 150mg  by mouth two times a day (get from [employer]).  take before breakfast and before bedtime 4)  Schedule follow up appointment  for Friday March 16th at 8:30 (double book) Prescriptions: RANITIDINE HCL 150 MG TABS (RANITIDINE HCL) One tablet by mouth two times a day - before breakfast and before bedtime dinner  #60 x 1   Entered and Authorized by:   Lehman Prom FNP   Signed by:   Lehman Prom FNP on 10/06/2010   Method used:   Print then Give to Patient   RxID:   [id number]    Orders Added: 1)  Est. Patient Level III [08657] 2)  T-CBC w/Diff [[id number]] 3)  UA Dipstick w/o Micro (manual) [81002]    Laboratory Results   Urine Tests  Date/Time Received: October 06, 2010 1:12 PM   Routine Urinalysis   Color: lt. yellow Appearance: Clear Glucose: negative   (Normal Range:  Negative) Bilirubin: negative   (Normal Range: Negative) Ketone: negative   (Normal Range: Negative) Spec. Gravity: 1.010   (Normal Range: 1.003-1.035) Blood: negative   (Normal Range: Negative) pH: 7.5   (Normal Range: 5.0-8.0) Protein: negative   (Normal Range: Negative) Urobilinogen: 0.2   (Normal Range: 0-1) Nitrite: negative   (Normal Range: Negative) Leukocyte Esterace: small   (Normal Range: Negative)

## 2010-10-13 NOTE — Progress Notes (Signed)
Summary: Stomach pain  Phone Note Call from Patient Call back at [phone]   Reason for Call: Acute Illness Summary of Call: Pt complaining of stomach ache for 2days wants to see Dr Delrae Alfred today. Initial call taken by: Ayesha Rumpf,  October 05, 2010 10:12 AM  Follow-up for Phone Call        Spoke with daughter who is interpreting -- stomach ache started Thursday morning, felt like "something was biting her."  Was constipated, last moved her bowels Sunday.  Stomach ache somewhat relieved, no nausea or vomiting, slight fever Saturday night only.  Having cramping, hurts when she moves.  Not normally having constipation, hasn't eaten anything different.  Able to pass flatus.  Stools are normal.  States she's drinking plenty of water.   Denies blood in stools.  States stomach pain goes around to her back, stomach was hard yesterday.  Denies CP or SOB.  Pain is currently 5-6.  States stomach pain is better today, not as hard.  Has hx of colon polpys.    Advised of no appointments this week with Dr. Delrae Alfred.  Given appt. with Jesse Fall 10/06/10 for evaluation of stomach pain.  Advised to increase fluids, use metamucil or citrucel to soften stools, may use senakot to aid in evacuation.   Follow-up by: Dutch Quint RN,  October 05, 2010 10:32 AM

## 2010-10-31 ENCOUNTER — Inpatient Hospital Stay (INDEPENDENT_AMBULATORY_CARE_PROVIDER_SITE_OTHER)
Admission: RE | Admit: 2010-10-31 | Discharge: 2010-10-31 | Disposition: A | Discharge status: Home / Self Care | Payer: Self-pay | Source: Ambulatory Visit | Attending: Emergency Medicine | Admitting: Emergency Medicine

## 2010-10-31 DIAGNOSIS — I889 Nonspecific lymphadenitis, unspecified: Secondary | ICD-10-CM

## 2010-11-19 ENCOUNTER — Other Ambulatory Visit: Payer: Self-pay | Admitting: Physician Assistant

## 2010-11-20 ENCOUNTER — Other Ambulatory Visit (HOSPITAL_COMMUNITY): Payer: Self-pay | Admitting: Internal Medicine

## 2010-11-20 ENCOUNTER — Other Ambulatory Visit: Payer: Self-pay | Admitting: Internal Medicine

## 2010-11-20 DIAGNOSIS — Z780 Asymptomatic menopausal state: Secondary | ICD-10-CM

## 2010-11-20 DIAGNOSIS — Z1231 Encounter for screening mammogram for malignant neoplasm of breast: Secondary | ICD-10-CM

## 2010-11-20 DIAGNOSIS — N852 Hypertrophy of uterus: Secondary | ICD-10-CM

## 2010-11-20 DIAGNOSIS — E010 Iodine-deficiency related diffuse (endemic) goiter: Secondary | ICD-10-CM

## 2010-11-24 ENCOUNTER — Ambulatory Visit (HOSPITAL_COMMUNITY)
Admission: RE | Admit: 2010-11-24 | Discharge: 2010-11-24 | Disposition: A | Discharge status: Home / Self Care | Payer: Self-pay | Source: Ambulatory Visit | Attending: Internal Medicine | Admitting: Internal Medicine

## 2010-11-24 ENCOUNTER — Other Ambulatory Visit (HOSPITAL_COMMUNITY): Payer: Self-pay | Admitting: Internal Medicine

## 2010-11-24 DIAGNOSIS — N852 Hypertrophy of uterus: Secondary | ICD-10-CM

## 2010-11-24 DIAGNOSIS — E010 Iodine-deficiency related diffuse (endemic) goiter: Secondary | ICD-10-CM

## 2010-11-24 DIAGNOSIS — E041 Nontoxic single thyroid nodule: Secondary | ICD-10-CM | POA: Insufficient documentation

## 2010-12-09 ENCOUNTER — Other Ambulatory Visit (HOSPITAL_COMMUNITY): Payer: Self-pay | Admitting: Internal Medicine

## 2010-12-10 ENCOUNTER — Other Ambulatory Visit (HOSPITAL_COMMUNITY): Payer: Self-pay | Admitting: Internal Medicine

## 2010-12-10 DIAGNOSIS — E041 Nontoxic single thyroid nodule: Secondary | ICD-10-CM

## 2010-12-11 NOTE — Op Note (Signed)
NAME:  Jamie [name], Tritia                                  ACCOUNT NO.:  [id number]   MEDICAL RECORD NO.:  [id number]                   PATIENT TYPE:  AMB   LOCATION:  SDC                                  FACILITY:  [facility]   PHYSICIAN:  Sandra A. Rivard, M.D.              DATE OF BIRTH:  17-Jun-1956   DATE OF PROCEDURE:  09/07/2002  DATE OF DISCHARGE:                                 OPERATIVE REPORT   PREOPERATIVE DIAGNOSES:  Desire for sterilization.   POSTOPERATIVE DIAGNOSES:  Desire for sterilization.   ANESTHESIA:  General.   PROCEDURE:  Bilateral tubal ligation by laparoscopy.   SURGEON:  Crist Fat. Rivard, M.D.   ESTIMATED BLOOD LOSS:  Minimal.   PROCEDURE:  After being informed of the planned procedure with possible  complications including bleeding, infection, injury to other organs  requiring laparotomy, irreversibility as well as failure rate of 1 in 500,  patient is consented in the presence of her translator in the office and  informed consent is obtained and patient is taken to OR number three, given  general anesthesia with endotracheal intubation without complication.   She is placed in the lithotomy position, prepped and draped in a sterile  fashion.  Her bladder is emptied with an in-and-out catheter and an acorn  manipulator is placed in her uterus.  We proceed with infiltration of the  umbilical area with 5 mL of Marcaine 0.5, perform a semi elliptical  incision, insert Veress needle, insufflate pneumoperitoneum with CO2 at a  maximum pressure of 13 mmHg.  The Veress needle was removed and a 10 mm  trocar is inserted with the operative laparoscope.   OBSERVATION:  Anterior cul-de-sac is normal.  Posterior cul-de-sac is  normal.  Uterus is normal.  Left tube, left ovary normal.  Right tube:  Small adhesion with the ovary which is bluntly dissected.  Left ovary:  Small 1.5 cm thin wall clear cyst which ruptures upon manipulation revealing  clear fluid and adequate  hemostasis.   We proceed with cauterization of both tube in the isthmal ampullar area on a  distance of 1.5 cm including mesosalpinx.  After adequate cauterization  instruments are removed after evacuating pneumoperitoneum and the fascia of  the 10 mm incision is closed with a simple suture of 0 Vicryl.  Skin is  closed with a subcuticular suture of 4-0 Monocryl.  Instruments and sponge  count is complete x2.  Estimated blood loss minimal.  Procedure is well  tolerated by the patient who is taken to the recovery room in a well and  stable condition.                                               Crist Fat Rivard,  M.D.    SAR/MEDQ  D:  09/07/2002  T:  09/07/2002  Job:  [id number]

## 2010-12-16 ENCOUNTER — Other Ambulatory Visit: Payer: Self-pay | Admitting: Interventional Radiology

## 2010-12-16 ENCOUNTER — Ambulatory Visit (HOSPITAL_COMMUNITY)
Admission: RE | Admit: 2010-12-16 | Discharge: 2010-12-16 | Disposition: A | Discharge status: Home / Self Care | Payer: Self-pay | Source: Ambulatory Visit | Attending: Internal Medicine | Admitting: Internal Medicine

## 2010-12-16 DIAGNOSIS — E042 Nontoxic multinodular goiter: Secondary | ICD-10-CM | POA: Insufficient documentation

## 2010-12-16 DIAGNOSIS — E041 Nontoxic single thyroid nodule: Secondary | ICD-10-CM

## 2010-12-29 ENCOUNTER — Ambulatory Visit (HOSPITAL_COMMUNITY)
Admission: RE | Admit: 2010-12-29 | Discharge: 2010-12-29 | Disposition: A | Discharge status: Home / Self Care | Payer: Self-pay | Source: Ambulatory Visit | Attending: Internal Medicine | Admitting: Internal Medicine

## 2010-12-29 DIAGNOSIS — Z1231 Encounter for screening mammogram for malignant neoplasm of breast: Secondary | ICD-10-CM | POA: Insufficient documentation

## 2010-12-29 DIAGNOSIS — Z780 Asymptomatic menopausal state: Secondary | ICD-10-CM

## 2011-01-26 ENCOUNTER — Other Ambulatory Visit (HOSPITAL_COMMUNITY): Payer: Self-pay

## 2011-02-11 ENCOUNTER — Other Ambulatory Visit (INDEPENDENT_AMBULATORY_CARE_PROVIDER_SITE_OTHER): Payer: Self-pay | Admitting: Surgery

## 2011-02-11 ENCOUNTER — Encounter (HOSPITAL_COMMUNITY)
Admission: RE | Admit: 2011-02-11 | Discharge: 2011-02-11 | Disposition: A | Discharge status: Home / Self Care | Payer: Self-pay | Source: Ambulatory Visit | Attending: Surgery | Admitting: Surgery

## 2011-02-11 ENCOUNTER — Ambulatory Visit (HOSPITAL_COMMUNITY)
Admission: RE | Admit: 2011-02-11 | Discharge: 2011-02-11 | Disposition: A | Discharge status: Home / Self Care | Payer: Self-pay | Source: Ambulatory Visit | Attending: Surgery | Admitting: Surgery

## 2011-02-11 DIAGNOSIS — C73 Malignant neoplasm of thyroid gland: Secondary | ICD-10-CM | POA: Insufficient documentation

## 2011-02-11 DIAGNOSIS — I10 Essential (primary) hypertension: Secondary | ICD-10-CM | POA: Insufficient documentation

## 2011-02-11 DIAGNOSIS — Z01818 Encounter for other preprocedural examination: Secondary | ICD-10-CM | POA: Insufficient documentation

## 2011-02-11 DIAGNOSIS — Z01812 Encounter for preprocedural laboratory examination: Secondary | ICD-10-CM | POA: Insufficient documentation

## 2011-02-11 DIAGNOSIS — Z01810 Encounter for preprocedural cardiovascular examination: Secondary | ICD-10-CM | POA: Insufficient documentation

## 2011-02-11 LAB — COMPREHENSIVE METABOLIC PANEL
ALT: 47 U/L — ABNORMAL HIGH (ref 0–35)
AST: 31 U/L (ref 0–37)
Albumin: 4.5 g/dL (ref 3.5–5.2)
Alkaline Phosphatase: 77 U/L (ref 39–117)
Glucose, Bld: 92 mg/dL (ref 70–99)
Potassium: 4.2 mEq/L (ref 3.5–5.1)
Sodium: 135 mEq/L (ref 135–145)
Total Protein: 8 g/dL (ref 6.0–8.3)

## 2011-02-11 LAB — CBC
Hemoglobin: 13.7 g/dL (ref 12.0–15.0)
MCHC: 34.8 g/dL (ref 30.0–36.0)
Platelets: 280 10*3/uL (ref 150–400)

## 2011-02-18 ENCOUNTER — Other Ambulatory Visit (INDEPENDENT_AMBULATORY_CARE_PROVIDER_SITE_OTHER): Payer: Self-pay | Admitting: Surgery

## 2011-02-18 ENCOUNTER — Inpatient Hospital Stay (HOSPITAL_COMMUNITY)
Admission: RE | Admit: 2011-02-18 | Discharge: 2011-02-21 | DRG: 627 | Disposition: A | Discharge status: Home / Self Care | Payer: Medicaid Other | Source: Ambulatory Visit | Attending: Surgery | Admitting: Surgery

## 2011-02-18 DIAGNOSIS — D497 Neoplasm of unspecified behavior of endocrine glands and other parts of nervous system: Secondary | ICD-10-CM

## 2011-02-18 DIAGNOSIS — C73 Malignant neoplasm of thyroid gland: Principal | ICD-10-CM | POA: Diagnosis present

## 2011-02-19 LAB — CALCIUM: Calcium: 8.2 mg/dL — ABNORMAL LOW (ref 8.4–10.5)

## 2011-02-19 LAB — CBC
HCT: 30.4 % — ABNORMAL LOW (ref 36.0–46.0)
Hemoglobin: 10.5 g/dL — ABNORMAL LOW (ref 12.0–15.0)
RBC: 3.51 MIL/uL — ABNORMAL LOW (ref 3.87–5.11)
WBC: 7.1 10*3/uL (ref 4.0–10.5)

## 2011-02-20 ENCOUNTER — Inpatient Hospital Stay (HOSPITAL_COMMUNITY): Payer: Medicaid Other

## 2011-02-20 LAB — CBC
Hemoglobin: 9.7 g/dL — ABNORMAL LOW (ref 12.0–15.0)
MCH: 29.9 pg (ref 26.0–34.0)
MCV: 88.9 fL (ref 78.0–100.0)
RBC: 3.24 MIL/uL — ABNORMAL LOW (ref 3.87–5.11)
WBC: 6.1 10*3/uL (ref 4.0–10.5)

## 2011-02-21 NOTE — Op Note (Signed)
NAME:  Jamie [name], Jamie [name]                      ACCOUNT NO.:  [id number]  MEDICAL RECORD NO.:  [id number]  LOCATION:  5118                         FACILITY:  [facility]  PHYSICIAN:  Abigail Miyamoto, M.D. DATE OF BIRTH:  Aug 27, 1955  DATE OF PROCEDURE:  02/18/2011 DATE OF DISCHARGE:                              OPERATIVE REPORT   PREOPERATIVE DIAGNOSIS:  Left thyroid cancer.  POSTOPERATIVE DIAGNOSIS:  Left thyroid cancer.  PROCEDURE:  Left thyroid lobectomy.  SURGEON:  Abigail Miyamoto, MD  ASSISTANT:  Mary Sella. Andrey Campanile, MD  ANESTHESIA:  General endotracheal anesthesia.  ESTIMATED BLOOD LOSS:  Minimal.  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  She now presents with a nodule in the superior pole of the thyroid gland which had been biopsied and proven to show papillary thyroid cancer.  Decision was made to proceed with left thyroid lobectomy.  FINDINGS:  The patient was found to have a hard fixed mass in the upper pole of thyroid gland.  The strap muscles were fixed to the mass and the mass was also fixed to the carotid sheath and underlying musculature.  PROCEDURE IN DETAIL:  The patient was brought to the operating room, identified as Jamie [name].  She was placed supine on the operating table and general anesthesia was induced.  The patient's neck was then prepped and draped in usual sterile fashion.  Using a 15 blade, I made incision through a previous scar on the lower neck.  This was taken down to the platysma with electrocautery.  Superior and inferior skin flaps were then created with cautery.  I then identified the midline after placing retractors in the neck and opened it between the strap muscles with electrocautery.  The muscles on the left side were fixed to the thyroid gland itself.  Once I elevated off the gland, it was determined that the lower pole was quite small but the upper pole had a fixed  very hard mass.  Again, overlying muscle was stuck to this mass.  I stayed close to the thyroid gland and I dissected the muscles off the gland, identified the middle vein which was clipped with surgical clips.  Also, I was able to identify the recurrent laryngeal nerve as I took down the lower pole and elevated it out.  The mass on the superior pole was so fixed to the surrounding structures.  I had to stay at the level of the trachea and took the thyroid gland off, moving medial to lateral. Again, I did this after I identified the recurrent laryngeal nerve and kept it separate.  As I dissected further, it became apparent that the gland was also fixed to the underlying carotid vessels.  I then had to place a clip on a small branch coming off the jugular vein.  I then had to consult Vascular Surgery and Dr. Imogene Burn did an intraoperative consultation and helped to take the thyroid gland off the carotid artery after obtaining proximal and distal control.  It became apparent that the gland was stuck to the common carotid well below the  bulb.  At this point, I then dissected the gland further from the superior pole.  I took down several pole vessels, surgical clips, and the Harmonic scalpel.  The gland was so fixed, I did have to leave some gland behind for fear of injuring the esophagus or the underlying structures.  I was able to finally completely remove the rest of the gland except for a small portion with the electrocautery.  The thyroid gland was then sent to Pathology for evaluation.  I then irrigated the neck with saline. Hemostasis appeared to be achieved.  I then placed several pieces of __________ in neck.  During this, I had to cut the strap muscles in order to facilitate the dissection as well.  Again, we were able to identify the carotid artery and the vagus nerve and recurrent laryngeal nerve and all appeared to be intact.  Again, there were no large lymph nodes in neck, but some  of the gland had to remain in the superior pole. At this point, hemostasis appeared to be achieved.  I reapproximated the strap muscles with 2-0 Vicryl sutures.  I then reapproximated the midline with interrupted 2-0 Vicryl sutures.  I closed the platysma with interrupted 3-0 Vicryl sutures.  I closed the skin with running 4-0 Monocryl.  Steri-Strips and gauze and tape were then applied.  The patient tolerated the procedure.  She was then extubated in the operating room and taken in stable condition to recovery room.  All sponge, needle, and instrument counts were correct at the end of the procedure.     Abigail Miyamoto, M.D.     DB/MEDQ  D:  02/18/2011  T:  02/18/2011  Job:  [id number]  Electronically Signed by Abigail Miyamoto M.D. on 02/21/2011 02:05:15 PM

## 2011-02-24 NOTE — Op Note (Signed)
NAME:  [name], Jamie                      ACCOUNT NO.:  [id number]  MEDICAL RECORD NO.:  [id number]  LOCATION:  5118                         FACILITY:  [facility]  PHYSICIAN:  Fransisco Hertz, MD       DATE OF BIRTH:  1956-03-02  DATE OF PROCEDURE: DATE OF DISCHARGE:                              OPERATIVE REPORT  PROCEDURE: Mobilization of left common carotid artery from a thyroid cancer.  PREOPERATIVE DIAGNOSIS:  Papillary thyroid cancer.  POSTOPERATIVE DIAGNOSIS:  Papillary thyroid cancer.  SURGEON:  Fransisco Hertz, MD  ASSISTANT:  Newton Pigg, PA and Dr. Magnus Ivan.  ANESTHESIA:  General.  FINDINGS:  (For my portion of this case) left common carotid artery that was successfully mobilized from the left thyroid cancer with no evidence of invasion of the cancer into the artery.  SPECIMENS:  Please refer to Dr. Eliberto Ivory note.  ESTIMATED BLOOD LOSS:  Should also be found in Dr. Eliberto Ivory note.  INDICATIONS:  This is a 55 year old patient with papillary thyroid cancer.  Dr. Magnus Ivan had requested an intraoperative vascular consultation due to finding the left papillary thyroid cancer densely adherent to the carotid artery on the left side.  No consent was obtained as this was a unexpected procedure.  DESCRIPTION OF OPERATION:  This patient's thyroid lobectomy was already in process.  When I was asked to come into this case to assist, the neck was already exposed with [facility] retractor in place.  A portion of the papillary thyroid cancer had been mobilized.  The lateral and superior  portion appeared to be densely adherent to the patient's left carotid artery.  I dissected out the proximal common carotid artery, placed an umbilical tape  around it and I began dissecting more superiorly.  It appeared that the cancer  was densely adherent to the external carotid artery.  I dissected out the  internal carotid artery, placed a vessel loop around it and then I began slowly mobilizing  the tumor off of what I thought was the external carotid artery.  In the process of doing this, it became evident to me this was not the external carotid artery.  Moving from the proximal common carotid artery, I dissected in a periadventitial fashion and was able to mobilize off the carotid sheath.  In this process I fully exposed the left common carotid artery.  This was exposed all the way to the superior extent of this cancer and then I verified that there was in fact no involvement of the cancer with the common carotid artery.  I then assisted Dr. Magnus Ivan with completion of his dissection of this cancer.  Prior to completion of this case, we verified that there was no injury to the common carotid  artery and there was good a pulse throughout this artery.  At this point  I broke scrub and Dr. Magnus Ivan completed the rest of this procedure.  COMPLICATIONS AND CONDITIONS:  Please refer to Dr. Eliberto Ivory note.     Fransisco Hertz, MD     BLC/MEDQ  D:  02/18/2011  T:  02/18/2011  Job:  [id number]  Electronically Signed by Leonides Sake MD on 02/24/2011  08:23:02 AM

## 2011-03-03 ENCOUNTER — Encounter (INDEPENDENT_AMBULATORY_CARE_PROVIDER_SITE_OTHER): Payer: Self-pay | Admitting: Surgery

## 2011-03-04 ENCOUNTER — Encounter (INDEPENDENT_AMBULATORY_CARE_PROVIDER_SITE_OTHER): Payer: Self-pay | Admitting: Surgery

## 2011-03-08 ENCOUNTER — Encounter (INDEPENDENT_AMBULATORY_CARE_PROVIDER_SITE_OTHER): Payer: Self-pay | Admitting: Surgery

## 2011-03-08 ENCOUNTER — Ambulatory Visit (INDEPENDENT_AMBULATORY_CARE_PROVIDER_SITE_OTHER): Payer: PRIVATE HEALTH INSURANCE | Admitting: Surgery

## 2011-03-08 VITALS — Temp 97.1°F | Wt 123.4 lb

## 2011-03-08 DIAGNOSIS — C73 Malignant neoplasm of thyroid gland: Secondary | ICD-10-CM

## 2011-03-08 NOTE — Progress Notes (Signed)
This is a prior patient of mine from [facility]. Please fax note to [facility].

## 2011-03-08 NOTE — Progress Notes (Signed)
Subjective:     Patient ID: Jamie [name], female   DOB: 1955/10/22, 55 y.o.   MRN: [id number]  HPI  She is here for her first postoperative visit status post left thyroid lobectomy for papillary cancer. Again this was not completely resectable as it was stuck to the carotid artery and esophagus. She currently has minimal complaints and minimal discomfort. Her voice is returning to normal. Review of Systems     Objective:   Physical Exam On exam, her incision is healing well and her voice is normal with no hoarseness    Assessment:     This is a patient with papillary thyroid cancer which was unable to be completely resected.    Plan:     She is currently on Synthroid. I will now need to refer her to an endocrinologist for consideration of radioactive iodine and further long-term followup. I will see her back.

## 2011-03-10 NOTE — Discharge Summary (Signed)
  NAME:  [name], Jamie                      ACCOUNT NO.:  [id number]  MEDICAL RECORD NO.:  [id number]  LOCATION:  5118                         FACILITY:  [facility]  PHYSICIAN:  Abigail Miyamoto, M.D. DATE OF BIRTH:  1956/06/28  DATE OF ADMISSION:  02/18/2011 DATE OF DISCHARGE:  02/21/2011                              DISCHARGE SUMMARY   DISCHARGE DIAGNOSIS:  Papillary thyroid cancer status post left thyroid lobectomy.  SUMMARY OF HISTORY:  This is a 55 year old female, who has had a previous right thyroid lobectomy.  She presents now with a thyroid nodule with fine-needle aspiration confirming papillary thyroid cancer. Decision was made to proceed with left thyroid lobectomy.  HOSPITAL COURSE:  The patient was admitted and underwent a left thyroid lobectomy, which was quite a difficult procedure secondary to extension of the tumor.  She tolerated the procedure well, however, was taken to a regular surgical floor postoperatively.  On postop day #1, she was still having painful swallowing.  She was otherwise without complaint.  There was no neck hematoma.  Her calcium was stable at 8.2.  Because of her persistent difficulty swallowing, decision was made to perform a Gastrografin swallow to rule out esophageal injury.  This was negative for injury.  By postop day #3, she was doing much better.  Her pain was better controlled.  Her voice sounded better.  There was no hematoma in the incision and decision was made to discharge the patient home.  DISCHARGE DIET:  As tolerated.  DISCHARGE ACTIVITY:  She would do no heavy lifting or vigorous activity for several weeks.  She may shower.  DISCHARGE FOLLOWUP:  She will follow up in my office in 1-2 weeks post discharge.     Abigail Miyamoto, M.D.     DB/MEDQ  D:  03/03/2011  T:  03/03/2011  Job:  [id number]  Electronically Signed by Abigail Miyamoto M.D. on 03/10/2011 01:39:53 PM

## 2011-04-09 ENCOUNTER — Ambulatory Visit (INDEPENDENT_AMBULATORY_CARE_PROVIDER_SITE_OTHER): Payer: PRIVATE HEALTH INSURANCE | Admitting: Surgery

## 2011-04-09 ENCOUNTER — Encounter (INDEPENDENT_AMBULATORY_CARE_PROVIDER_SITE_OTHER): Payer: Self-pay | Admitting: Surgery

## 2011-04-09 VITALS — BP 118/86 | HR 60

## 2011-04-09 DIAGNOSIS — Z09 Encounter for follow-up examination after completed treatment for conditions other than malignant neoplasm: Secondary | ICD-10-CM

## 2011-04-09 NOTE — Progress Notes (Signed)
Subjective:     Patient ID: Jamie [name], female   DOB: April 03, 1956, 55 y.o.   MRN: [id number]  HPI She is here for another postoperative visit status post thyroidectomy for unresectable papillary thyroid cancer. She is still having some difficulty swallowing. She was unable to see the endocrinologist we referred her to as she is a health service patient.  Review of Systems     Objective:   Physical Exam On examination, I can feel no large neck masses. She has typical postoperative scarring and swelling    Assessment:     Patient with partly resected papillary thyroid cancer    Plan:     She again needs to be seen by an endocrinologist who could coordinate her need for radioactive iodine. We will [facility] back to [facility] so they can make these arrangements

## 2011-04-09 NOTE — Progress Notes (Signed)
Please fax to [facility].  This is one of my old patients. Tereso Newcomer, PA-C

## 2011-06-15 ENCOUNTER — Encounter (HOSPITAL_COMMUNITY): Payer: Self-pay | Admitting: *Deleted

## 2011-06-15 ENCOUNTER — Encounter (HOSPITAL_COMMUNITY)
Admission: RE | Disposition: A | Discharge status: Home / Self Care | Payer: Self-pay | Source: Ambulatory Visit | Attending: Gastroenterology

## 2011-06-15 ENCOUNTER — Ambulatory Visit (HOSPITAL_COMMUNITY)
Admission: RE | Admit: 2011-06-15 | Discharge: 2011-06-15 | Disposition: A | Discharge status: Home / Self Care | Payer: Medicaid Other | Source: Ambulatory Visit | Attending: Gastroenterology | Admitting: Gastroenterology

## 2011-06-15 ENCOUNTER — Other Ambulatory Visit: Payer: Self-pay | Admitting: Gastroenterology

## 2011-06-15 DIAGNOSIS — Z860100 Personal history of colon polyps, unspecified: Secondary | ICD-10-CM | POA: Insufficient documentation

## 2011-06-15 DIAGNOSIS — Z09 Encounter for follow-up examination after completed treatment for conditions other than malignant neoplasm: Secondary | ICD-10-CM | POA: Insufficient documentation

## 2011-06-15 HISTORY — PX: COLONOSCOPY: SHX5424

## 2011-06-15 SURGERY — COLONOSCOPY
Anesthesia: Moderate Sedation

## 2011-06-15 MED ORDER — MIDAZOLAM HCL 10 MG/2ML IJ SOLN
INTRAMUSCULAR | Status: DC | PRN
  Administered 2011-06-15 (×2): 2 mg via INTRAVENOUS
  Administered 2011-06-15: 1 mg via INTRAVENOUS

## 2011-06-15 MED ORDER — FENTANYL CITRATE 0.05 MG/ML IJ SOLN
INTRAMUSCULAR | Status: AC
  Filled 2011-06-15: qty 2

## 2011-06-15 MED ORDER — FENTANYL CITRATE 0.05 MG/ML IJ SOLN
INTRAMUSCULAR | Status: DC | PRN
  Administered 2011-06-15 (×2): 25 ug via INTRAVENOUS

## 2011-06-15 MED ORDER — SODIUM CHLORIDE 0.9 % IV SOLN
Freq: Once | INTRAVENOUS | Status: AC
  Administered 2011-06-15: 500 mL via INTRAVENOUS

## 2011-06-15 MED ORDER — MIDAZOLAM HCL 10 MG/2ML IJ SOLN
INTRAMUSCULAR | Status: AC
  Filled 2011-06-15: qty 2

## 2011-06-15 NOTE — H&P (Signed)
  Please see scans H&P for details of our recent outpatient visit

## 2011-06-15 NOTE — Discharge Instructions (Signed)
N?i Soi ??i Trng  (Colonoscopy) Bc s? ch? ??nh n?i soi ??i trng. ?y l m?t khm nghi?m ?? ?nh gi ton b? ??i trng. Trong khm nghi?m ny, ??i trng ???c lm cho s?ch s?. M?t ?ng s?i quang h?c di ???c ??a xuyn qua tr?c trng v vo trong ??i trng. ?ng soi b?ng s?i quang h?c (?ng n?i soi) l m?t b di cc s?i r?t d?o v ???c b?c kn. Nh?ng s?i ny truy?n nh sng t?i vng ???c khm nghi?m v g?i cc hnh ?nh t? vng ? ??n Bc s?. S? b?t ti?n th??ng l r?t nh?. C th? dng m?t lo?i thu?c gip b?nh nhn ng? (m) trong su?t th?i gian hay tr??c khi ti?n hnh th? thu?t. Khm nghi?m ny gip pht hi?n cc b??u (cc kh?i u), polyp, tnh tr?ng vim v cc vng ?ang xu?t huy?t. Bc s? c?ng c th? l?y m?t m?u m nh? (sinh thi?t) ?? khm nghi?m d??i knh hi?n vi. HY BO CHO BC S? BI?T V?:  Cc d? ?ng.   Cc thu?c ?ang dng k? c? th?o d??c, thu?c nh? m?t, cc thu?c ???c mua khng c?n ??n thu?c c?a Bc s? v cc lo?i kem thoa ngoi da.   S? d?ng cc thu?c nhm steroid (lo?i u?ng hay kem thoa ngoi da).   Nh?ng v?n ?? tr??c ?y c lin quan ??n cc thu?c gy m ho?c gy t.   B?nh s? xu?t huy?t hay cc v?n ?? v? mu v? huy?t kh?i   Ph?u thu?t tr??c ?y.   Cc v?n ?? s?c kh?e khc, bao g?m c? b?nh ti?u ???ng v cc v?n ?? v? th?n.   Kh? n?ng c thai (n?u ?ang mong ??i ?i?u ny).  TR??C KHI TH?C HI?N TH? THU?T  C th? c?n ch? ?? ?n ton n??c sp trong 2 ngy tr??c khi khm nghi?m.   Hy h?i chuyn gia ch?m [place] y t? v? vi?c thay ??i ho?c d?ng thu?c thng th??ng c?a b?n.   C th? c?n ph?i b?m th?t ru?t hay dng cc thu?c x?.   M?t l??ng l?n dung d?ch ch?t ?i?n gi?i c th? ???c u?ng h?t trong m?t th?i gian ng?n. Dung d?ch ny ???c dng ?? r?a ??i trng.   Nn c m?t tr??c 60 pht khi lm th? thu?t hay theo h??ng d?n c?a Bc s?.  SAU TH? THU?T  N?u dng thu?c gy ng? v/ho?c thu?c gi?m ?au, s? c?n thu x?p ?? m?t ng??i no ? li xe ??a v? nh.   ?i khi, c ra m?t t mu khi ?i c?u l?n ??u  tin. ??NG lo l?ng.  TM HI?U K?T QU? XT NGHI?M Khng ph?i m?i k?t qu? ??u c s?n trong l?n khm c?a b?n. N?u k?t qu? xt nghi?m c?a b?n khng ???c tr? l?i trong l?n khm c?a b?n, hy h?n v?i chuyn gia ch?m [place] y t? ?? tm hi?u k?t qu?Imagene Sheller cho m?i th? l bnh th??ng n?u b?n khng th?y chuyn gia ch?m [place] y t? ho?c c? s? y t? ni g. ?i?u quan tr?ng ??i v?i b?n l theo di m?i k?t qu? xt nghi?m c?a b?n. H??NG D?N CH?M [place] T?I NH  Sau th? thu?t, khng c g b?t th??ng n?u x ra m?t l??ng h?i v?a ph?i v tr?i qua c?n ?au qu?n b?ng nh?. Hi?n t??ng ny l do khng kh ???c s? d?ng ?? b?m c?ng ??i trng trong qu trnh khm nghi?m. ?i b? hay ch??m nng ln b?ng c th? gip lm  d?u b?t.   C th? ?n tr? l?i bnh th??ng v ph?c h?i cc ho?t ??ng sau khi thu?c gy ng? v cc thu?c khc tan h?t.   Ch? dng cc thu?c ???c bn khng c?n ??n thu?c c?a Bc s? ho?c theo toa c?a Bc s? ?? gi?m ?au, kh ch?u, hay s?t theo nh? h??ng d?n c?a Bc s?.Ch? dng cc thu?c ny n?u Bc s? [facility] cho cc lo?i thu?c m s? c?n tr? cc thu?c nu trn. KHNG s? d?ng aspirin hay thu?c ch?ng ?ng mu n?u c lm sinh thi?t. H?i  ki?n Bc s? v? vi?c dng thu?c n?u c lm sinh thi?t.  HY NGAY L?P T?C THAM V?N V?I CHUYN GIA Y T? N?U:  B?n b? s?t.   ?i c?u ra nhi?u c?c mu l?n hay mu ra ??y b?n c?u sau khi lm th? thu?t. Hi?n t??ng ny c?ng c th? x?y ra t? 10 ??n 14 ngy sau khi lm th? thu?t. Hi?n t??ng ny c nhi?u kh? n?ng x?y ra h?n n?u c lm sinh thi?t.   ?au b?ng ngy cng nghim tr?ng h?n v c th? khng ?? h?n sau khi dng thu?c.     (Diverticulitis)  Ti th?a l m?t ti nh? ho?c ti trn ??i trng. Vim ti th?a l s? hi?n di?n c?a chi nang trn ??i trng. Vim ti th?a l s? kch ?ng (vim) ho?c nhi?m trng c?a chi nang.  NGUYN NHN ??i trng v cc ti th?a l n?i tr ng? c?a cc m?m b?nh (vi trng). N?u cc m?nh v?n th?c ?n lm t?c ngh?n nh?ng l? thng nh? c?a ti th?a, vi trng bn trong c th? sinh si v  lm t?ng p l?c ti th?a. ?i?u ny d?n ??n nhi?m trng v vim nhi?m v ???c g?i l vim ti th?a. TRI?U CH?NG  ?au v nh?y c?m ?au ? b?ng. Th??ng hay b? ?au ? bn tri b?ng. Tuy nhin, n c th? b? ? ch? khc.   S?t.   ??y h?i.   C?m th?y kh ch?u trong d? dy (bu?n nn).   i (nn m?a).   Phn khng bnh th??ng.  CH?N ?ON B?nh s? v th?m khm lm sng s? gip ch? ra ch?n ?on vim ti th?a. Do c nhi?u nguyn nhn gy ra ?au b?ng, c th? c?n thm cc xt nghi?m khc. Cc xt nghi?m c th? bao g?m:  Xt nghi?m mu.   Xt nghi?m n??c ti?u.   Ch?p X quang b?ng.   Ch?p CT b?ng.  ?i khi c?n ph?u thu?t ?? xc ??nh xem c ph?i do vim ti th?a hay cc tnh tr?ng b?nh l khc gy ra cc tri?u ch?ng c?a b?n. ?I?U TR? ?a s? tr??ng h?p, b?nh nhn c th? ???c ?i?u tr? m khng c?n ph?u thu?t. Vi?c ?i?u tr? bao g?m:  ?? cho ru?t ngh? ng?i b?ng cch h?n ch? ch? dng cc th?c ?n l?ng trong vi ngy. Khi ?? h?n, b?n s? c?n ?n m?t ch? ?? ?n t ch?t x?.   Cc lo?i d?ch truy?n b?ng ???ng t?nh m?ch n?u b? m?t n??c.   Khng sinh ?i?u tr? nhi?m trng.   Thu?c gi?m ?au v ch?ng bu?n nn n?u c?n thi?t.   B?n c th? c?n ph?u thu?t n?u n? chi nang b? vim.  H??NG D?N CH?M [place] T?I NH  Th? ch? ?? ?n u?ng b?ng ch?t l?ng trong (canh, ch ho?c n??c trong mi?n l theo s? ch? d?n c?a chuyn gia ch?m [place] y t?  c?a b?n). Sau ?, b?n c th? d?n d?n b?t ??u ch? ?? ?n t ch?t x? ty theo s? ph?n ?ng c?a c? th?. Ch? ?? ?n t ch?t x? l ch? ?? ?n c t h?n 10 gam ch?t x?. Ch?n cc lo?i th?c ph?m d??i ?y ?? lm gi?m ch?t x? trong ch? ?? ?n:   Bnh m tr?ng, ng? c?c, g?o v m ?ng.   Tri cy v rau ???c n?u chn ho?c tri cy t??i v rau qu? m?m m khng c v?.   Th?t b m?m ???c nghi?n ho?c n?u chn k?, gi?m bng, th?t b, th?t c?u, th?t l?n, gia c?m.   Tr?ng v h?i s?n.   Sau khi cc tri?u ch?ng vim ti th?a ? ???c c?i thi?n, bc s? c?a b?n c th? chuy?n b?n sang ch? ?? ?n nhi?u ch?t x?. Ch? ?? ?n  nhi?u ch?t x? bao g?m 14 gam ch?t x? cho m?i 1.000 calo tiu th?. ??i v?i m?t ch? ?? ?n 2000 calo tiu chu?n, b?n s? c?n 28 gam ch?t x?. Th?c hi?n theo cc h??ng d?n ch? ?? ?n ny ?? gip b?n t?ng c??ng ch?t x? trong ch? ?? ?n c?a mnh. ?i?u quan tr?ng l t? t? t?ng l??ng ch?t x? trong ch? ?? ?n c?a b?n ?? trnh kh, to bn v ??y h?i.   Ch?n bnh m nguyn h?t, ng? c?c, m ?ng v g?o nu.   Ch?n tri cy v rau t??i cn v?. Khng n?u rau qu chn v n?u rau cng lu th cng m?t nhi?u ch?t x?.   Ch?n nhi?u lo?i c? qu?, h?t, ??u H Lan kh v ??u l?ng.   Hy tm nh?ng s?n ph?m th?c ph?m c trn 3 gam ch?t x? m?i kh?u ph?n trn nhn Thnh Ph?n Dinh D??ng.   S? d?ng t?t c? thu?c theo ch? d?n c?a chuyn gia ch?m [place] y t?.   N?u chuyn gia ch?m  y t? c?a b?n h?n khm l?i, ?i?u quan tr?ng l b?n ph?i khm l?i. Vi?c khng ?i khm l?i c th? d?n ??n ch?n th??ng, ?au, tn t?t v?nh vi?n (mn tnh). N?u c b?t k? v?n ?? no v?i vi?c gi? cu?c h?n, hy g?i ?i?n ?? s?p x?p l?i cu?c h?n.  ?I KHM B?NH N?U:  ?au khng ?? nh? mong ??i.   Kh chuy?n ch? ?? ?n ngo?i tr? th?c ?n l?ng ton b?.   Nhu ??ng ru?t khng tr? v? bnh th??ng.  NH?P VI?N NGAY L?P T?C N?U:  C?n ?au tr? nn nghim tr?ng h?n.   Nhi?t ?? ?o ? mi?ng trn 102 F (38.9 C), khng gi?m sau khi dng thu?c.   i m?a lin t?c.   Mu l?n trong phn (?? t??i hay phn ?en s?t nh? h?c n).   N?u cc tri?u ch?ng m ? ??a b?n ?i khm Bc s? tr? nn x?u h?n hay khng ?? h?n.  HY CH?C CH?N R?NG B?N:  Hi?u r nh?ng h??ng d?n khi xu?t vi?n.   S? theo di tnh tr?ng b?nh c?a b?n.   S? ??n khm b?nh ngay l?p t?c nh? ? ???c h??ng d?n.  Document Released: 04/21/2005 Document Revised: 03/24/2011 [facility] Patient Information 2012 [place], [place].. Call if significant pain fever nausea or vomiting or signs of significant bleeding or call in one week for biopsy results

## 2011-06-16 ENCOUNTER — Encounter (HOSPITAL_COMMUNITY): Payer: Self-pay

## 2011-06-16 ENCOUNTER — Encounter (INDEPENDENT_AMBULATORY_CARE_PROVIDER_SITE_OTHER): Payer: Self-pay | Admitting: Surgery

## 2011-06-22 ENCOUNTER — Encounter (HOSPITAL_COMMUNITY): Payer: Self-pay | Admitting: Gastroenterology

## 2011-07-02 ENCOUNTER — Other Ambulatory Visit (INDEPENDENT_AMBULATORY_CARE_PROVIDER_SITE_OTHER): Payer: Self-pay | Admitting: Surgery

## 2011-07-03 ENCOUNTER — Other Ambulatory Visit (INDEPENDENT_AMBULATORY_CARE_PROVIDER_SITE_OTHER): Payer: Self-pay | Admitting: Surgery

## 2011-07-12 ENCOUNTER — Ambulatory Visit (INDEPENDENT_AMBULATORY_CARE_PROVIDER_SITE_OTHER): Payer: Medicaid Other | Admitting: Surgery

## 2011-07-12 ENCOUNTER — Encounter (INDEPENDENT_AMBULATORY_CARE_PROVIDER_SITE_OTHER): Payer: Self-pay | Admitting: Surgery

## 2011-07-12 VITALS — BP 112/86 | HR 56 | Temp 97.3°F | Resp 16 | Ht 59.0 in | Wt 118.1 lb

## 2011-07-12 DIAGNOSIS — C73 Malignant neoplasm of thyroid gland: Secondary | ICD-10-CM

## 2011-07-12 NOTE — Progress Notes (Signed)
Subjective:     Patient ID: Jamie [name], female   DOB: May 10, 1956, 55 y.o.   MRN: [id number]  HPI  She is currently by her daughter as it again she is speak [place]. I operated on her thyroid in July. She is now just an endocrinologist and apparently leaking radioactive iodine starting in March. She has some difficulty sleeping from coughing. It is difficult to say whether she has any real pain. Review of Systems     Objective:   Physical Exam On exam, her incision is well healed. There are no masses.    Assessment:     Patient with thyroid cancer status post incomplete resection.    Plan:     From a general surgical standpoint there is nothing further to offer at this point. Again she will need radioactive iodine. She received her primary care physician regarding any further coughs or pain medication. I will see her back in 6 months

## 2011-10-19 ENCOUNTER — Other Ambulatory Visit (HOSPITAL_COMMUNITY): Payer: Self-pay | Admitting: Endocrinology

## 2011-10-19 DIAGNOSIS — C73 Malignant neoplasm of thyroid gland: Secondary | ICD-10-CM

## 2011-10-29 ENCOUNTER — Encounter (HOSPITAL_COMMUNITY)
Admission: RE | Admit: 2011-10-29 | Discharge: 2011-10-29 | Disposition: A | Discharge status: Home / Self Care | Payer: Medicaid Other | Source: Ambulatory Visit | Attending: Endocrinology | Admitting: Endocrinology

## 2011-10-29 ENCOUNTER — Encounter (HOSPITAL_COMMUNITY): Payer: Self-pay

## 2011-10-29 DIAGNOSIS — C73 Malignant neoplasm of thyroid gland: Secondary | ICD-10-CM | POA: Insufficient documentation

## 2011-10-29 MED ORDER — SODIUM IODIDE I 131 CAPSULE
129.3000 | Freq: Once | INTRAVENOUS | Status: AC | PRN
  Administered 2011-10-29: 129.3 via ORAL

## 2011-11-08 ENCOUNTER — Encounter (HOSPITAL_COMMUNITY): Payer: Self-pay

## 2011-11-08 ENCOUNTER — Ambulatory Visit (HOSPITAL_COMMUNITY)
Admission: RE | Admit: 2011-11-08 | Discharge: 2011-11-08 | Disposition: A | Discharge status: Home / Self Care | Payer: Medicaid Other | Source: Ambulatory Visit | Attending: Endocrinology | Admitting: Endocrinology

## 2011-11-08 DIAGNOSIS — C73 Malignant neoplasm of thyroid gland: Secondary | ICD-10-CM | POA: Insufficient documentation

## 2011-12-28 ENCOUNTER — Ambulatory Visit (INDEPENDENT_AMBULATORY_CARE_PROVIDER_SITE_OTHER): Payer: Medicaid Other | Admitting: Surgery

## 2012-01-18 ENCOUNTER — Ambulatory Visit (INDEPENDENT_AMBULATORY_CARE_PROVIDER_SITE_OTHER): Payer: PRIVATE HEALTH INSURANCE | Admitting: Surgery

## 2012-01-18 ENCOUNTER — Encounter (INDEPENDENT_AMBULATORY_CARE_PROVIDER_SITE_OTHER): Payer: Self-pay | Admitting: Surgery

## 2012-01-18 VITALS — BP 112/60 | HR 64 | Temp 97.4°F | Resp 14 | Ht 59.0 in | Wt 118.4 lb

## 2012-01-18 DIAGNOSIS — C73 Malignant neoplasm of thyroid gland: Secondary | ICD-10-CM

## 2012-01-18 NOTE — Progress Notes (Signed)
Subjective:     Patient ID: Jamie [name], female   DOB: April 16, 1956, 55 y.o.   MRN: [id number]  HPI She is here for a 6 month followup regarding her thyroid cancer. Again she had resection of significant cancer which could not be completely excised at the time of surgery. There is a significant language barrier with her and her family and I'm uncertain exactly the treatment she is undergoing right now. She had a recent thyroid scan showing uptake in the thyroid bed as expected. She complains only of cough  Review of Systems     Objective:   Physical Exam On exam, there are no palpable masses in the neck. I can feel no adenopathy    Assessment:     Patient with a history of thyroid cancer status post incomplete excision    Plan:     I will see her back in a year. I have no further recommendations other than medical management, radioactive iodine, et Karie Soda. She may need referral to ear nose and throat surgeon as well.

## 2012-11-03 ENCOUNTER — Encounter (INDEPENDENT_AMBULATORY_CARE_PROVIDER_SITE_OTHER): Payer: Self-pay | Admitting: Surgery

## 2012-11-14 ENCOUNTER — Emergency Department (HOSPITAL_COMMUNITY)
Admission: EM | Admit: 2012-11-14 | Discharge: 2012-11-14 | Disposition: A | Discharge status: Home / Self Care | Payer: No Typology Code available for payment source | Source: Home / Self Care

## 2012-11-14 ENCOUNTER — Encounter (HOSPITAL_COMMUNITY): Payer: Self-pay

## 2012-11-14 DIAGNOSIS — M79609 Pain in unspecified limb: Secondary | ICD-10-CM

## 2012-11-14 DIAGNOSIS — M79671 Pain in right foot: Secondary | ICD-10-CM

## 2012-11-14 DIAGNOSIS — E119 Type 2 diabetes mellitus without complications: Secondary | ICD-10-CM

## 2012-11-14 DIAGNOSIS — I10 Essential (primary) hypertension: Secondary | ICD-10-CM

## 2012-11-14 MED ORDER — TRIAMTERENE-HCTZ 37.5-25 MG PO TABS: 1.0000 | ORAL_TABLET | Freq: Every day | ORAL | Status: DC

## 2012-11-14 MED ORDER — LEVOTHYROXINE SODIUM 75 MCG PO TABS: 75.0000 ug | ORAL_TABLET | Freq: Every day | ORAL | Status: DC

## 2012-11-14 MED ORDER — NAPROXEN SODIUM 275 MG PO TABS: 275.0000 mg | ORAL_TABLET | Freq: Two times a day (BID) | ORAL | Status: DC

## 2012-11-14 NOTE — ED Provider Notes (Signed)
History     CSN: [id number]  Arrival date & time 11/14/12  1038   First MD Initiated Contact with Patient 11/14/12 1117      Chief Complaint  Patient presents with  . Foot Swelling     HPI 57 year old [place] female with history of diet-controlled diabetes, hypertension, papillary thyroid carcinoma status post thyroidectomy in 2012 and on Synthroid he is brought in by her daughter for right foot pain with swelling for possible weeks. Denies any trauma or injury. Denies any redness or discoloration. Denies any fever, chills, , blurry vision, chest pain, palpitations, shortness of breath, bone pain, nausea, vomiting, bowel or urinary symptoms.  Past Medical History  Diagnosis Date  . Thyroid disease   . Hypertension   . Uterine enlargement   . Diabetes mellitus   . Osteopenia   . Arthritis   . Cancer     thyroid  . Uterine enlargement     Past Surgical History  Procedure Laterality Date  . Thyroid lobectomy    . Total thyroidectomy  2012  . Colonoscopy  06/15/2011    Procedure: COLONOSCOPY;  Surgeon: Petra Kuba, MD;  Location: [facility] ENDOSCOPY;  Service: Endoscopy;  Laterality: N/A;    Family History  Problem Relation Age of Onset  . Cancer Father     lung    History  Substance Use Topics  . Smoking status: Never Smoker   . Smokeless tobacco: Never Used  . Alcohol Use: No    OB History   Grav Para Term Preterm Abortions TAB SAB Ect Mult Living                  Review of Systems As outlined in history of present illness Allergies  Review of patient's allergies indicates no known allergies.  Home Medications   Current Outpatient Rx  Name  Route  Sig  Dispense  Refill  . levothyroxine (SYNTHROID, LEVOTHROID) 75 MCG tablet   Oral   Take 1 tablet (75 mcg total) by mouth daily.   30 tablet   3   . naproxen sodium (ANAPROX) 275 MG tablet   Oral   Take 1 tablet (275 mg total) by mouth 2 (two) times daily with a meal.   20 tablet   0   .  triamterene-hydrochlorothiazide (MAXZIDE-25) 37.5-25 MG per tablet   Oral   Take 1 each (1 tablet total) by mouth daily.   30 tablet   3     BP 131/89  Pulse 66  Temp(Src) 98.1 F (36.7 C) (Oral)  Resp 17  SpO2 100%  Physical Exam Middle-aged female in no acute distress HEENT: No pallor, moist oral mucosa Chest: Clear to auscultation bilaterally CVS: Normal S1 and S2 Abdomen: Soft, nontender, bowel sounds present Extremities: Warm, no edema, no swelling of the foot are not tender to palpation over the dorsum of right foot, normal range of motion CNS: AAO x3 ED Course  Procedures (including critical care time)  Labs Reviewed - No data to display No results found.   1. DIABETES MELLITUS, MILD   2. HYPERTENSION   3. Right foot pain     Right foot pain with history of swelling No obvious swelling or deformity noted. I will check an x-ray of the right foot. i will prescribe her a course of naproxen.   Diabetes mellitus Appears to be diet controlled and not on medication. We'll check A1c.  Hypertension Blood pressure stable. On triamterene-HCTZ which I would continue. Check renal function  and lipid panel  Hx of papillary thyroid  adenocarcinoma Status post thyroidectomy in 2012. Follows with Dr Magnus Ivan annually. continue synthroid. Check TSH  Return back to the clinic in one month  Instructions given to daughter on diet and medication and hence  MDM  Follow up in 1 month        Eoin Willden, MD 11/14/12 1149

## 2012-11-14 NOTE — ED Notes (Signed)
Complain of right foot swollen for almost a week

## 2012-12-12 ENCOUNTER — Encounter (INDEPENDENT_AMBULATORY_CARE_PROVIDER_SITE_OTHER): Payer: Self-pay | Admitting: Surgery

## 2012-12-12 ENCOUNTER — Telehealth (INDEPENDENT_AMBULATORY_CARE_PROVIDER_SITE_OTHER): Payer: Self-pay | Admitting: General Surgery

## 2012-12-12 ENCOUNTER — Ambulatory Visit (INDEPENDENT_AMBULATORY_CARE_PROVIDER_SITE_OTHER): Payer: PRIVATE HEALTH INSURANCE | Admitting: Surgery

## 2012-12-12 ENCOUNTER — Other Ambulatory Visit (INDEPENDENT_AMBULATORY_CARE_PROVIDER_SITE_OTHER): Payer: Self-pay | Admitting: Surgery

## 2012-12-12 VITALS — BP 122/76 | HR 86 | Temp 98.2°F | Resp 18 | Ht 64.0 in | Wt 121.0 lb

## 2012-12-12 DIAGNOSIS — R1310 Dysphagia, unspecified: Secondary | ICD-10-CM

## 2012-12-12 DIAGNOSIS — C73 Malignant neoplasm of thyroid gland: Secondary | ICD-10-CM

## 2012-12-12 NOTE — Telephone Encounter (Signed)
Left message for Jamie [name]   To call me I have appt with Dr Pollyann Kennedy at 12/15/12 1:15p, [address]  [address]

## 2012-12-12 NOTE — Progress Notes (Signed)
Subjective:     Patient ID: Jamie [name], female   DOB: 05/21/1956, 57 y.o.   MRN: [id number]  HPI She is here for long-term followup. Again, she is a 57 year old female who speaks no [place]. She had a thyroidectomy in 2012 for extensive thyroid cancer. At the surgery, vascular surgery had to help get the tumor off of the carotid artery the left side. Gross tumor was left behind. She has since had radioactive ablation. She is followed closely by her primary care physician. She's been having intermittent difficulty swallowing. This has been off and on since her original surgery. Again, it is difficult to communicate with her secondary to language barrier.  Review of Systems     Objective:   Physical Exam On exam, there are no palpable nodules or masses in the neck    Assessment:     History of thyroid cancer with difficulty swallowing     Plan:     I believe she needs referral to ENT for evaluation and possible laryngoscopy. I'll see her back after their evaluation

## 2013-01-23 ENCOUNTER — Encounter (INDEPENDENT_AMBULATORY_CARE_PROVIDER_SITE_OTHER): Payer: PRIVATE HEALTH INSURANCE | Admitting: Surgery

## 2013-02-02 ENCOUNTER — Other Ambulatory Visit: Payer: Self-pay | Admitting: Otolaryngology

## 2013-02-02 DIAGNOSIS — R1310 Dysphagia, unspecified: Secondary | ICD-10-CM

## 2013-02-13 ENCOUNTER — Other Ambulatory Visit: Payer: Self-pay | Admitting: Otolaryngology

## 2013-02-13 DIAGNOSIS — R1310 Dysphagia, unspecified: Secondary | ICD-10-CM

## 2013-02-19 ENCOUNTER — Encounter (INDEPENDENT_AMBULATORY_CARE_PROVIDER_SITE_OTHER): Payer: PRIVATE HEALTH INSURANCE | Admitting: Surgery

## 2013-02-21 ENCOUNTER — Other Ambulatory Visit: Payer: Self-pay | Admitting: Surgery

## 2013-02-21 ENCOUNTER — Ambulatory Visit
Admission: RE | Admit: 2013-02-21 | Discharge: 2013-02-21 | Disposition: A | Discharge status: Home / Self Care | Payer: No Typology Code available for payment source | Source: Ambulatory Visit | Attending: Otolaryngology | Admitting: Otolaryngology

## 2013-02-21 DIAGNOSIS — R1310 Dysphagia, unspecified: Secondary | ICD-10-CM

## 2013-02-21 MED ORDER — GADOBENATE DIMEGLUMINE 529 MG/ML IV SOLN
10.0000 mL | Freq: Once | INTRAVENOUS | Status: AC | PRN
  Administered 2013-02-21: 10 mL via INTRAVENOUS

## 2013-02-26 ENCOUNTER — Encounter (INDEPENDENT_AMBULATORY_CARE_PROVIDER_SITE_OTHER): Payer: Self-pay | Admitting: Surgery

## 2013-03-01 IMAGING — US US SOFT TISSUE HEAD/NECK
1 series · 13 of 25 positions shown · non-contrast
Comparison: None.

CLINICAL DATA: History of previous right thyroidectomy.  History of
enlargement of the remainder of the thyroid gland on the left.

THYROID ULTRASOUND
TECHNIQUE: Ultrasound examination of the thyroid gland and adjacent
soft tissues was performed.

[Series 1: us soft tissue head/neck · 0.08mm/px · 13 of 48 slices shown]
[im 1/48]
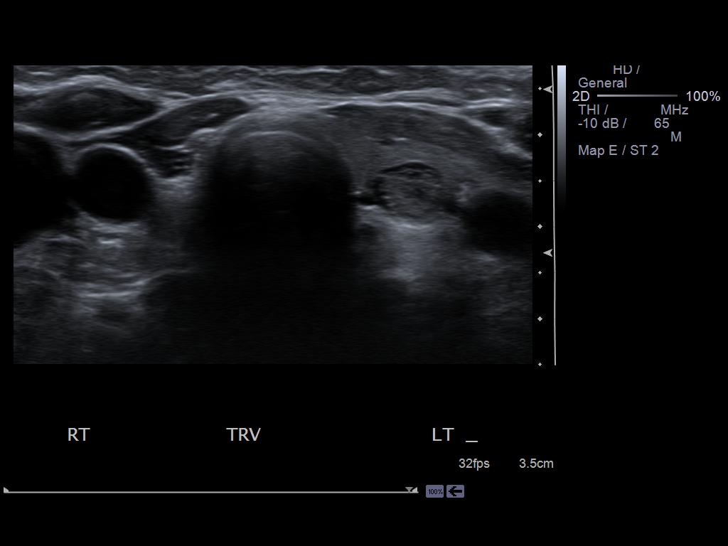
[im 4/48]
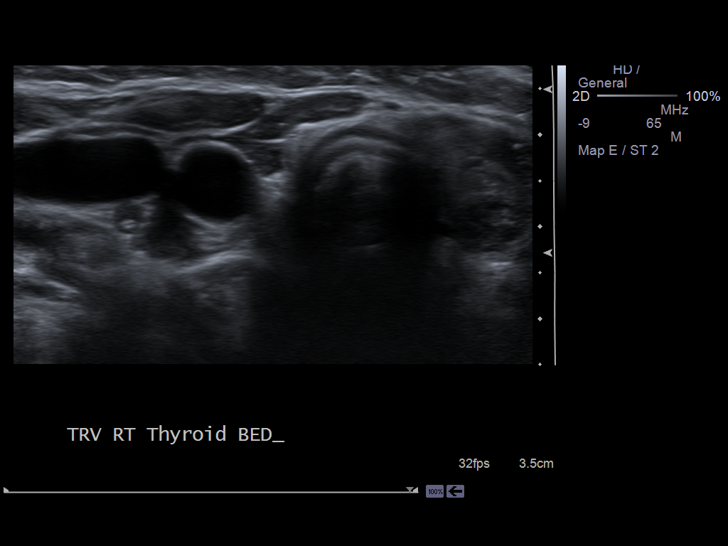
[im 8/48]
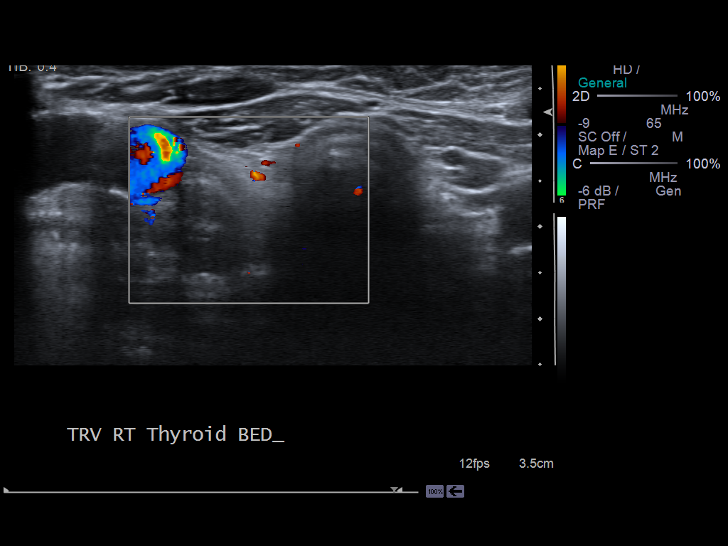
[im 12/48]
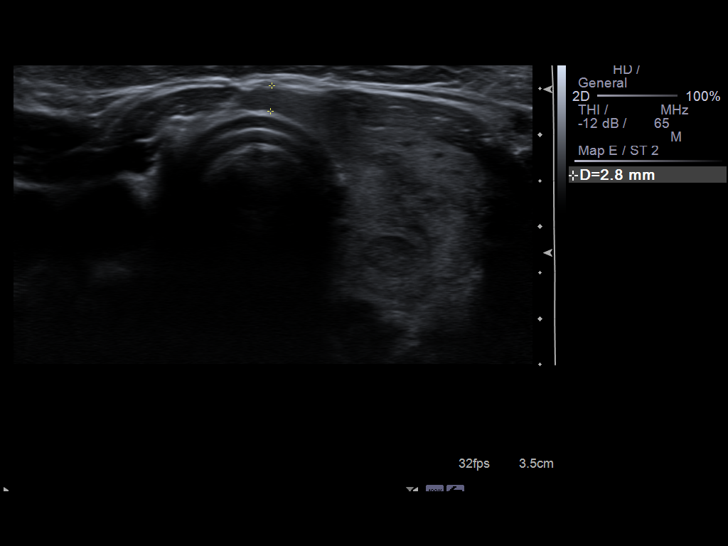
[im 16/48]
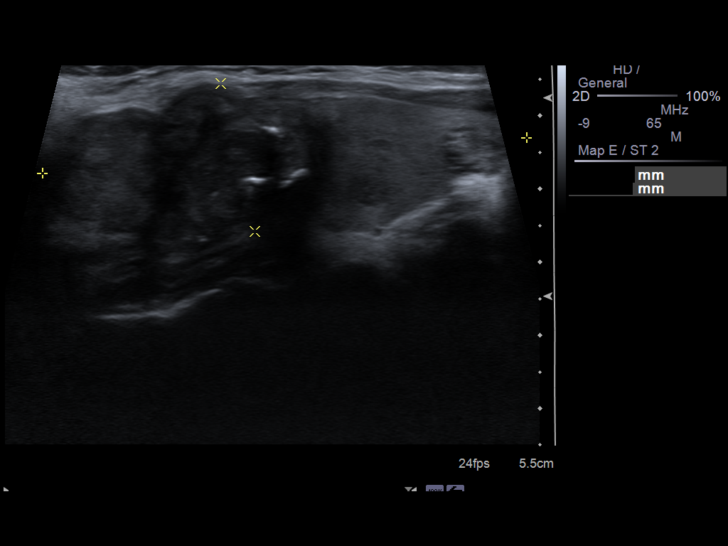
[im 20/48]
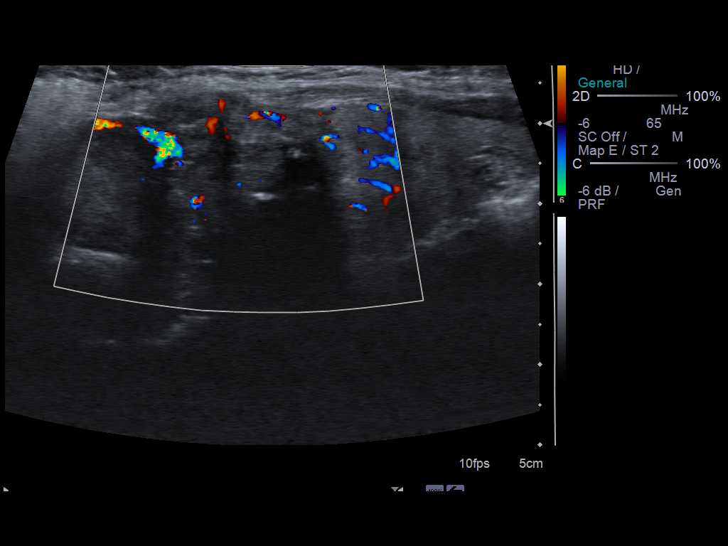
[im 24/48]
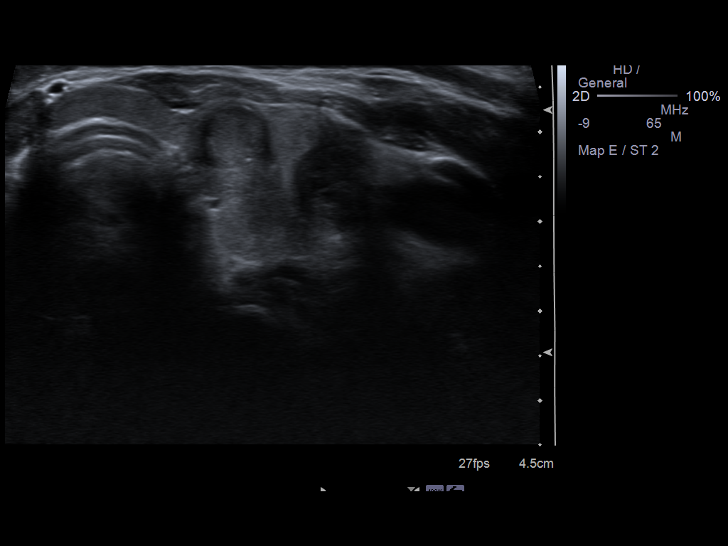
[im 28/48]
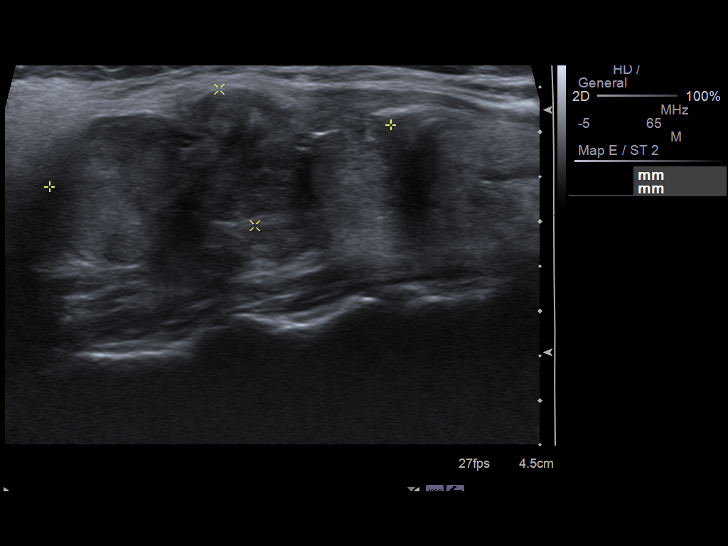
[im 32/48]
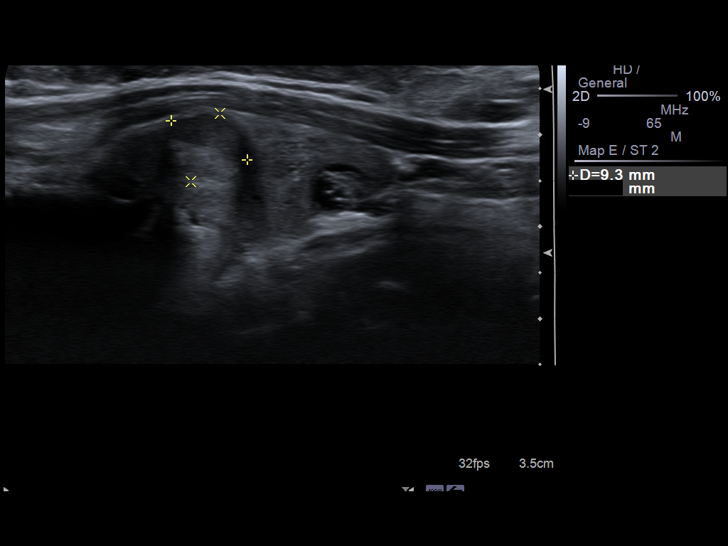
[im 36/48]
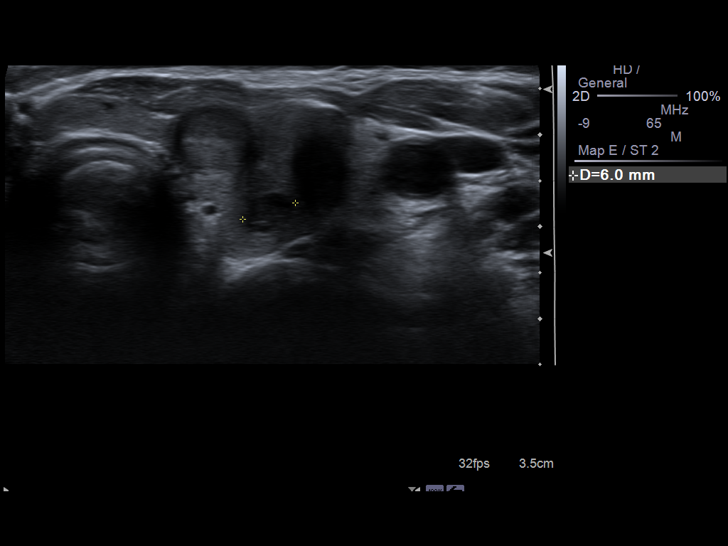
[im 40/48]
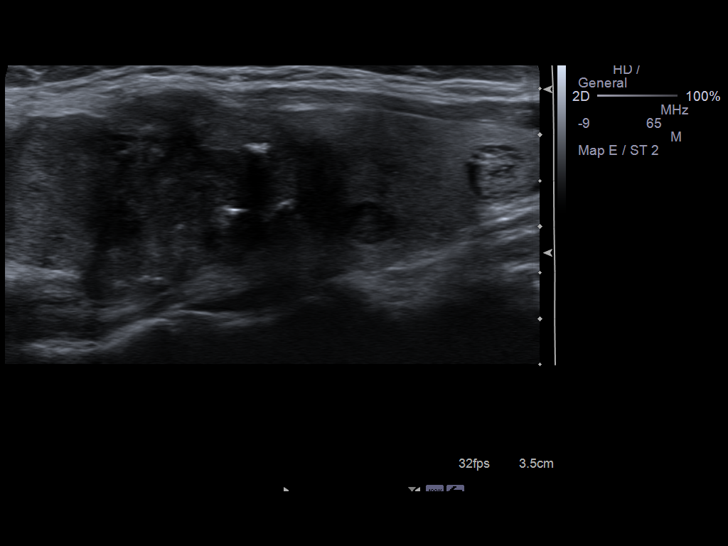
[im 44/48]
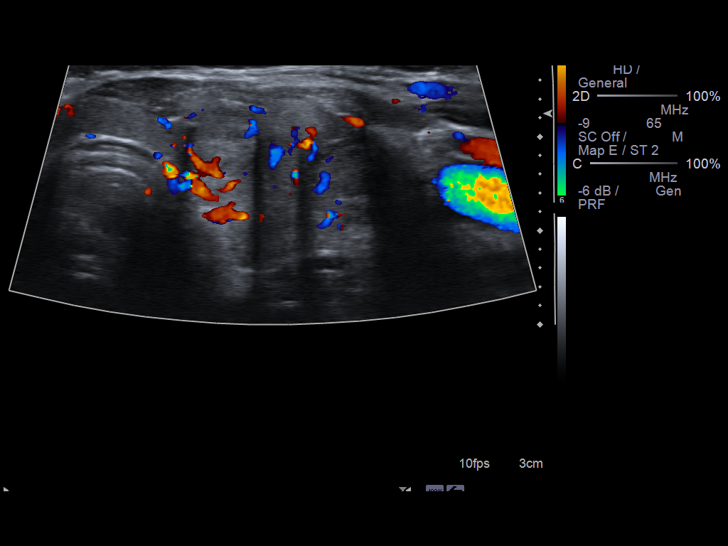
[im 48/48]
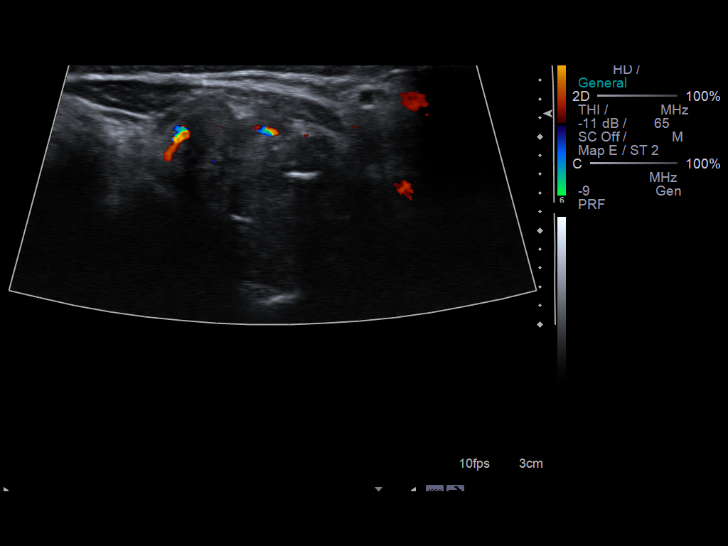

[13 of 25 positions shown; findings below may reference images not displayed]

FINDINGS: Right thyroid lobe:  By history previous right lobe of the thyroid
thyroidectomy has been performed.  The right lobe is not evident by
ultrasound.

Left thyroid lobe:  Left lobe of the thyroid measures 6.6 x 2.1 x
2.3 cm with volume of 15.9 ml. This indicates thyromegaly of the
left lobe of the thyroid.

Isthmus:  No abnormality.  AP diameter 2.8 mm.

Focal nodules:  Multiple complex nodular densities are seen within
the left lobe of the thyroid.

The largest nodular area is in the upper pole and measures 3.9 x
2.2 x 1.6 cm. This is the overall dimension. It is lobulated. It
could reflect several contiguous masses rather than one single
mass.

Two slightly hypoechoic areas are seen in the midportion of the
left lobe of the thyroid.  These small masses measures 0.9 x 0.9 x
0.8 cm and 0.9 x 0.7 x 0.5 cm respectively.  In the lower pole of
the left lobe of the thyroid is a hypoechoic predominately solid
nodular area measuring 0.9 x 0.9 x 0.6 cm. The smaller masses may
be followed.

Lymphadenopathy:  None visualized.
IMPRESSION: Previous right thyroid lobectomy.  No abnormality of the isthmus.
Left lobar thyromegaly. Multinodular goiter involving left lobe.
The small masses in the mid and lower portion of the left lobe of
the thyroid are of the size which may be followed.

The dominant nodular mass in the upper pole if it is one mass has a
greatest diameter of 3.9 cm.  If it is contiguous nodular areas
there is still a greatest dimension of 2.2 cm.  This is the size of
nodular lesion in the thyroid for which recommendation is given for
ultrasound-guided fine needle aspiration biopsy.

This recommendation follows the consensus statement:  Management of
Thyroid Nodules Detected as US:  Society of Radiologists in
800.  Available online at :
[URL]

## 2013-04-24 ENCOUNTER — Encounter (INDEPENDENT_AMBULATORY_CARE_PROVIDER_SITE_OTHER): Payer: Self-pay

## 2013-05-10 ENCOUNTER — Emergency Department (HOSPITAL_COMMUNITY)
Admission: EM | Admit: 2013-05-10 | Discharge: 2013-05-10 | Disposition: A | Discharge status: Home / Self Care | Payer: No Typology Code available for payment source | Source: Home / Self Care | Attending: Emergency Medicine | Admitting: Emergency Medicine

## 2013-05-10 ENCOUNTER — Encounter (HOSPITAL_COMMUNITY): Payer: Self-pay | Admitting: Emergency Medicine

## 2013-05-10 DIAGNOSIS — M5430 Sciatica, unspecified side: Secondary | ICD-10-CM

## 2013-05-10 DIAGNOSIS — M5432 Sciatica, left side: Secondary | ICD-10-CM

## 2013-05-10 MED ORDER — OXYCODONE-ACETAMINOPHEN 5-325 MG PO TABS: ORAL_TABLET | ORAL | Status: DC

## 2013-05-10 MED ORDER — KETOROLAC TROMETHAMINE 60 MG/2ML IM SOLN
60.0000 mg | Freq: Once | INTRAMUSCULAR | Status: AC
  Administered 2013-05-10: 60 mg via INTRAMUSCULAR

## 2013-05-10 MED ORDER — METHYLPREDNISOLONE ACETATE 80 MG/ML IJ SUSP
INTRAMUSCULAR | Status: AC
  Filled 2013-05-10: qty 1

## 2013-05-10 MED ORDER — METHYLPREDNISOLONE ACETATE 80 MG/ML IJ SUSP
80.0000 mg | Freq: Once | INTRAMUSCULAR | Status: AC
  Administered 2013-05-10: 80 mg via INTRAMUSCULAR

## 2013-05-10 MED ORDER — METHOCARBAMOL 500 MG PO TABS: 500.0000 mg | ORAL_TABLET | Freq: Three times a day (TID) | ORAL | Status: DC

## 2013-05-10 MED ORDER — KETOROLAC TROMETHAMINE 60 MG/2ML IM SOLN
INTRAMUSCULAR | Status: AC
  Filled 2013-05-10: qty 2

## 2013-05-10 MED ORDER — MELOXICAM 15 MG PO TABS: 15.0000 mg | ORAL_TABLET | Freq: Every day | ORAL | Status: DC

## 2013-05-10 NOTE — ED Notes (Signed)
Pt  Reports    Low  Back pain  With  Pain  Radiating  Down l  Leg            With  The symptoms       Starting  Yesterday  After         Doing  Housework             Pt ambulated to  Room with a  Slow  Steady gait

## 2013-05-10 NOTE — ED Provider Notes (Signed)
Chief Complaint:   Chief Complaint  Patient presents with  . Leg Pain    History of Present Illness:   Jamie [name] is a 57 year old female who has had a two-day history of pain in her lower back radiating into her left thigh. This is an 8/10 at the most and now is down to 5/10. She denies any injury. The pain is worse if she stands or walks and better she rests. She denies any numbness, tingling, or weakness. No bladder or bowel complaints. She denies any fever, chills, or unintended weight loss. She denies any abdominal pain. She has not had back problems in the past. 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 History was obtained with the help of her daughter who serves as an Equities trader.  Review of Systems:  Other than noted above, the patient denies any of the following symptoms: Systemic:  No fever, chills, severe fatigue, or unexplained weight loss. GI:  No abdominal pain, nausea, vomiting, diarrhea, constipation, incontinence of bowel, or blood in stool. GU:  No dysuria, frequency, urgency, or hematuria. No incontinence of urine or difficulty urinating.  M-S:  No neck pain, joint pain, arthritis, or myalgias. Neuro:  No paresthesias, saddle anesthesia, muscular weakness, or progressive neurological deficit.  PMFSH:  Past medical history, family history, social history, meds, and allergies were reviewed. Specifically, there is no history of cancer, major trauma, osteoporosis, immunosuppression, or HIV infection. She has a history of high blood pressure and hypothyroidism. She takes the naproxen and Maxide.  Physical Exam:   Vital signs:  BP 147/98  Pulse 72  Temp(Src) 98.6 F (37 C) (Oral)  Resp 16  SpO2 100% General:  Alert, oriented, in no distress. Abdomen:  Soft, non-tender.  No organomegaly or mass.  No pulsatile midline abdominal mass or bruit. Back:  There was no pain to palpation. Back had a full range of motion with minimal pain. Straight leg raising was negative. Neuro:   Normal muscle strength, sensations and DTRs. Extremities: Pedal pulses were full, there was no edema. Skin:  Clear, warm and dry.  No rash.  Assessment:  The encounter diagnosis was Sciatica, left.  Plan:   1.  Meds:  The following meds were prescribed:   Discharge Medication List as of 05/10/2013  1:06 PM    START taking these medications   Details  meloxicam (MOBIC) 15 MG tablet Take 1 tablet (15 mg total) by mouth daily., Starting 05/10/2013, Until Discontinued, Normal    methocarbamol (ROBAXIN) 500 MG tablet Take 1 tablet (500 mg total) by mouth 3 (three) times daily., Starting 05/10/2013, Until Discontinued, Normal    oxyCODONE-acetaminophen (PERCOCET) 5-325 MG per tablet 1 to 2 tablets every 6 hours as needed for pain., Print        2.  Patient Education/Counseling:  The patient was given appropriate handouts, self care instructions, and instructed in symptomatic relief. The patient was encouraged to try to be as active as possible and given some exercises to do followed by moist heat.  3.  Follow up:  The patient was told to follow up if no better in 2-3 weeks, if becoming worse in any way, and given some red flag symptoms such as worsening pain or new neurological symptoms which would prompt immediate return.  Follow up here if necessary.     Reuben Likes, MD 05/10/13 [phone]

## 2013-07-12 ENCOUNTER — Emergency Department (HOSPITAL_COMMUNITY)
Admission: EM | Admit: 2013-07-12 | Discharge: 2013-07-12 | Disposition: A | Discharge status: Home / Self Care | Payer: No Typology Code available for payment source | Attending: Emergency Medicine | Admitting: Emergency Medicine

## 2013-07-12 ENCOUNTER — Encounter (HOSPITAL_COMMUNITY): Payer: Self-pay | Admitting: Emergency Medicine

## 2013-07-12 DIAGNOSIS — E079 Disorder of thyroid, unspecified: Secondary | ICD-10-CM | POA: Insufficient documentation

## 2013-07-12 DIAGNOSIS — E119 Type 2 diabetes mellitus without complications: Secondary | ICD-10-CM | POA: Insufficient documentation

## 2013-07-12 DIAGNOSIS — R209 Unspecified disturbances of skin sensation: Secondary | ICD-10-CM | POA: Insufficient documentation

## 2013-07-12 DIAGNOSIS — M129 Arthropathy, unspecified: Secondary | ICD-10-CM | POA: Insufficient documentation

## 2013-07-12 DIAGNOSIS — Z79899 Other long term (current) drug therapy: Secondary | ICD-10-CM | POA: Insufficient documentation

## 2013-07-12 DIAGNOSIS — Z85850 Personal history of malignant neoplasm of thyroid: Secondary | ICD-10-CM | POA: Insufficient documentation

## 2013-07-12 DIAGNOSIS — M899 Disorder of bone, unspecified: Secondary | ICD-10-CM | POA: Insufficient documentation

## 2013-07-12 DIAGNOSIS — M5430 Sciatica, unspecified side: Secondary | ICD-10-CM | POA: Insufficient documentation

## 2013-07-12 DIAGNOSIS — Z8742 Personal history of other diseases of the female genital tract: Secondary | ICD-10-CM | POA: Insufficient documentation

## 2013-07-12 DIAGNOSIS — I10 Essential (primary) hypertension: Secondary | ICD-10-CM | POA: Insufficient documentation

## 2013-07-12 DIAGNOSIS — M5432 Sciatica, left side: Secondary | ICD-10-CM

## 2013-07-12 MED ORDER — PREDNISONE 20 MG PO TABS
ORAL_TABLET | ORAL | Status: DC
Start: 2013-07-12 — End: 2013-08-22

## 2013-07-12 MED ORDER — METHOCARBAMOL 500 MG PO TABS: 500.0000 mg | ORAL_TABLET | Freq: Two times a day (BID) | ORAL | Status: DC

## 2013-07-12 MED ORDER — TRAMADOL HCL 50 MG PO TABS: 50.0000 mg | ORAL_TABLET | Freq: Four times a day (QID) | ORAL | Status: DC | PRN

## 2013-07-12 MED ORDER — OXYCODONE-ACETAMINOPHEN 5-325 MG PO TABS: 1.0000 | ORAL_TABLET | Freq: Three times a day (TID) | ORAL | Status: DC | PRN

## 2013-07-12 NOTE — ED Notes (Signed)
Pt complains of left leg pain, has been seen at an ucc and given pain meds and now has ran out and continues to have pain, sts pain from hip down leg.

## 2013-07-12 NOTE — ED Notes (Signed)
Pt does not want Percocet. Requests something less strong. PA informed.

## 2013-07-12 NOTE — Discharge Planning (Signed)
Partnership for [facility] [facility]ia[facility] ext [phone]  Patient is a former orange card holder at the Adult care clinic that was at the [facility] urgent care. A follow up appointment was made with the [facility] for 08/02/13 at 11:30am to re-establish primary care and to obtain the orange card. Patient was given the application and my contact information for any future questions or concerns.

## 2013-07-12 NOTE — ED Provider Notes (Signed)
CSN: [id number]     Arrival date & time 07/12/13  1011 History  This chart was scribed for non-physician practitioner, Raymon Mutton, PA-C working with Raeford Razor, MD by Greggory Stallion, ED scribe. This patient was seen in room TR11C/TR11C and the patient's care was started at 12:23 PM.   Chief Complaint  Patient presents with  . Leg Pain   The history is provided by the patient. A language interpreter was used (pt's daughter).   HPI Comments: Jamie [name] is a 57 y/o F with PMHx of thyroid disease, HTN, Dm, arthritis, cancer presenting to the ED with lower back pain with radiation down the left leg that has been ongoing for the past 2 months. Patient reported that the discomfort is described as a tingling sensation down the left leg with aching in the lower back. Patient reported that [facility] where she was diagnosed with sciatica - stated that she was discharged with medications that aided in relief of her discomfort. Patient reported that she is currently out of her medications. Stated that she has been using Tylenol as needed with minimal relief. Denied chest pain, shortness of breath, difficulty breathing, fall, injuries, urinary and bowel incontinence, loss of sensation, weakness, numbness.  PCP none    Patient speaks [place] Daughter interpreter for mother.   Past Medical History  Diagnosis Date  . Thyroid disease   . Hypertension   . Uterine enlargement   . Diabetes mellitus   . Osteopenia   . Arthritis   . Cancer     thyroid  . Uterine enlargement    Past Surgical History  Procedure Laterality Date  . Thyroid lobectomy    . Total thyroidectomy  2012  . Colonoscopy  06/15/2011    Procedure: COLONOSCOPY;  Surgeon: Petra Kuba, MD;  Location: [facility] ENDOSCOPY;  Service: Endoscopy;  Laterality: N/A;   Family History  Problem Relation Age of Onset  . Cancer Father     lung   History  Substance Use Topics  . Smoking status: Never  Smoker   . Smokeless tobacco: Never Used  . Alcohol Use: No   OB History   Grav Para Term Preterm Abortions TAB SAB Ect Mult Living                 Review of Systems  Constitutional: Negative for fever and chills.  Respiratory: Negative for shortness of breath.   Cardiovascular: Negative for chest pain.  Genitourinary:       Negative for bowel or bladder incontinence.   Musculoskeletal: Positive for back pain and myalgias.  Neurological: Positive for numbness. Negative for dizziness.  All other systems reviewed and are negative.    Allergies  Review of patient's allergies indicates no known allergies.  Home Medications   Current Outpatient Rx  Name  Route  Sig  Dispense  Refill  . levothyroxine (SYNTHROID, LEVOTHROID) 75 MCG tablet   Oral   Take 1 tablet (75 mcg total) by mouth daily.   30 tablet   3   . triamterene-hydrochlorothiazide (MAXZIDE-25) 37.5-25 MG per tablet   Oral   Take 1 each (1 tablet total) by mouth daily.   30 tablet   3   . methocarbamol (ROBAXIN) 500 MG tablet   Oral   Take 1 tablet (500 mg total) by mouth 2 (two) times daily.   20 tablet   0   . predniSONE (DELTASONE) 20 MG tablet  3 tabs po day one, then 2 tabs daily x 4 days   11 tablet   0   . traMADol (ULTRAM) 50 MG tablet   Oral   Take 1 tablet (50 mg total) by mouth every 6 (six) hours as needed.   15 tablet   0    BP 140/105  Pulse 75  Temp(Src) 97.7 F (36.5 C) (Oral)  Resp 20  Ht 4\' 11"  (1.499 m)  Wt 120 lb (54.432 kg)  BMI 24.22 kg/m2  SpO2 100%  Physical Exam  Nursing note and vitals reviewed. Constitutional: She is oriented to person, place, and time. She appears well-developed and well-nourished. No distress.  HENT:  Head: Normocephalic and atraumatic.  Eyes: EOM are normal.  Neck: Normal range of motion. Neck supple.  Negative neck stiffness Negative nuchal rigidity Negative pain upon palpation to the c-spine  Cardiovascular: Normal rate, regular  rhythm and normal heart sounds.  Exam reveals no gallop and no friction rub.   No murmur heard. Pulses:      Radial pulses are 2+ on the right side, and 2+ on the left side.       Dorsalis pedis pulses are 2+ on the right side, and 2+ on the left side.  Pulmonary/Chest: Effort normal and breath sounds normal. No respiratory distress. She has no wheezes. She has no rales.  Musculoskeletal: Normal range of motion. She exhibits tenderness.       Legs: Full ROM to upper and lower extremities without difficulty noted. Full flexion and extension of the knees bilaterally. Full ROM noted to the hips bilaterally. Negative deformities, swelling, inflammation, bulging noted to the cervical-thoracic-lumbosacral spine. Negative pain upon palpation to midspinal and paravertebral region. Discomfort upon palpation to the left buttock, posterior aspect of the left thigh. Negative pain upon palpation to the left calf and left foot.   Neurological: She is alert and oriented to person, place, and time. She exhibits normal muscle tone. Coordination normal.  Strength 5+/5+ to upper and lower extremities with resistance applied, equal distribution. Normal straight leg raise. Gait noted mild limp secondary to pain with applying pressure to the left leg. Negative step off. Negative drift. Proper heel to knee down shin bilaterally.   Skin: Skin is warm and dry.  Psychiatric: She has a normal mood and affect. Her behavior is normal.    ED Course  Procedures (including critical care time)  DIAGNOSTIC STUDIES: Oxygen Saturation is 100% on RA, normal by my interpretation.    COORDINATION OF CARE: 12:29 PM-Discussed treatment plan with pt at bedside and pt agreed to plan.   1:15 PM Nurse reported that patient is requesting terminal Percocet. Reported that Percocet makes her feel woozy.  Labs Review Labs Reviewed - No data to display Imaging Review No results found.  EKG Interpretation   None       MDM   1.  Sciatica, left    Filed Vitals:   07/12/13 1015 07/12/13 1308  BP: 140/105 128/90  Pulse: 75 86  Temp: 97.7 F (36.5 C)   TempSrc: Oral   Resp: 20   Height: 4\' 11"  (1.499 m)   Weight: 120 lb (54.432 kg)   SpO2: 100% 99%   I personally performed the services described in this documentation, which was scribed in my presence. The recorded information has been reviewed and is accurate.  Patient presenting to emergency department with left-sided back pain that radiates down the left leg has been ongoing since October 2014. Daughter reports  that mother was seen in urgent care Center and treated for back pain, reported that the medications given were helpful but the pain is returned. Reports that patient has not been followed up with anyone secondary to insurance issues. Alert and oriented. GCS 15. Heart rate and rhythm normal. Lungs clear to auscultation bilaterally to upper and lower lobes. Pulses palpable and strong, radial and DP 2+. Negative deformities noted to the cervical, thoracic, lumbosacral spine. Negative pain upon palpation to the mid spinal and paravertebral regions bilaterally. Full range of motion to upper lower tremors bilaterally. Normal straight leg raise. Discomfort upon palpation to the posterior aspect of the left thigh, negative pain upon palpation to the calf. Patient able to walk, proper gait-mild limp noted with applying pressure to the left leg. Heel-to-toe normal. Negative focal neurological deficits identified. Strength 5+5+ upper and lower tremors bilaterally. Sensation intact. Doubt cauda equina. Doubt epidural abscess. Suspicion to be sciatica. Patient stable, afebrile. Discharge patient with medications. For patient to orthopedics and neurosurgery. Discussed with patient to rest, heat. Discussed with patient to avoid any physical strenuous activity. Discussed with patient to closely monitor symptoms and symptoms are to worsen or change report back to emergency  department-strict return instructions given. Patient agreed plan of care, understood, all questions answered.  Piper Hassebrock, PA-C 07/13/13 1153

## 2013-07-20 NOTE — ED Provider Notes (Signed)
Medical screening examination/treatment/procedure(s) were performed by non-physician practitioner and as supervising physician I was immediately available for consultation/collaboration.  EKG Interpretation   None        Dyan Labarbera, MD 07/20/13 1711 

## 2013-08-02 ENCOUNTER — Ambulatory Visit: Payer: No Typology Code available for payment source | Attending: Internal Medicine | Admitting: Internal Medicine

## 2013-08-02 ENCOUNTER — Encounter: Payer: Self-pay | Admitting: Internal Medicine

## 2013-08-02 ENCOUNTER — Ambulatory Visit: Payer: No Typology Code available for payment source | Attending: Internal Medicine

## 2013-08-02 VITALS — BP 122/85 | HR 81 | Temp 98.3°F | Resp 14 | Ht 59.0 in | Wt 124.2 lb

## 2013-08-02 DIAGNOSIS — M545 Low back pain, unspecified: Secondary | ICD-10-CM | POA: Insufficient documentation

## 2013-08-02 DIAGNOSIS — M549 Dorsalgia, unspecified: Secondary | ICD-10-CM

## 2013-08-02 DIAGNOSIS — E119 Type 2 diabetes mellitus without complications: Secondary | ICD-10-CM

## 2013-08-02 LAB — POCT GLYCOSYLATED HEMOGLOBIN (HGB A1C): HEMOGLOBIN A1C: 5.9

## 2013-08-02 MED ORDER — TRAMADOL HCL 50 MG PO TABS: 50.0000 mg | ORAL_TABLET | Freq: Four times a day (QID) | ORAL | Status: DC | PRN

## 2013-08-02 NOTE — Addendum Note (Signed)
Addended by: Piper Hassebrock, [place] R on: 08/02/2013 12:17 PM   Modules accepted: Orders

## 2013-08-02 NOTE — Progress Notes (Signed)
Patient ID: Jamie [name], female   DOB: 1956/06/10, 58 y.o.   MRN: [id number]   CC:  HPI:  58 y/o F with PMHx of thyroid disease, HTN, Dm, arthritis, cancer presented to the ED on 12/18 with lower back pain with radiation down the left leg that has been ongoing for the past 2 months. Patient reported that the discomfort is described as a tingling sensation down the left leg with aching in the lower back. Patient reported that she was seen at the [facility] [place] 2 months ago where she was diagnosed with sciatica - stated that she was discharged with medications that aided in relief of her discomfort. Patient reported that she is currently out of her medications. Stated that she has been using Tylenol as needed with minimal relief.  She was prescribed prednisone, tramadol, Robaxin for pain on 12/18   No Known Allergies Past Medical History  Diagnosis Date  . Thyroid disease   . Hypertension   . Uterine enlargement   . Diabetes mellitus   . Osteopenia   . Arthritis   . Cancer     thyroid  . Uterine enlargement    Current Outpatient Prescriptions on File Prior to Visit  Medication Sig Dispense Refill  . levothyroxine (SYNTHROID, LEVOTHROID) 75 MCG tablet Take 1 tablet (75 mcg total) by mouth daily.  30 tablet  3  . triamterene-hydrochlorothiazide (MAXZIDE-25) 37.5-25 MG per tablet Take 1 each (1 tablet total) by mouth daily.  30 tablet  3  . methocarbamol (ROBAXIN) 500 MG tablet Take 1 tablet (500 mg total) by mouth 2 (two) times daily.  20 tablet  0  . predniSONE (DELTASONE) 20 MG tablet 3 tabs po day one, then 2 tabs daily x 4 days  11 tablet  0  . [DISCONTINUED] lisinopril (PRINIVIL,ZESTRIL) 5 MG tablet Take 5 mg by mouth daily.         No current facility-administered medications on file prior to visit.   Family History  Problem Relation Age of Onset  . Cancer Father     lung   History   Social History  . Marital Status: Married    Spouse Name: N/A    Number of Children: N/A   . Years of Education: N/A   Occupational History  . Not on file.   Social History Main Topics  . Smoking status: Never Smoker   . Smokeless tobacco: Never Used  . Alcohol Use: No  . Drug Use: No  . Sexual Activity:    Other Topics Concern  . Not on file   Social History Narrative  . No narrative on file    Review of Systems  Constitutional: As in history of present illness HENT: Negative for ear pain, nosebleeds, congestion, facial swelling, rhinorrhea, neck pain, neck stiffness and ear discharge.   Eyes: Negative for pain, discharge, redness, itching and visual disturbance.  Respiratory: Negative for cough, choking, chest tightness, shortness of breath, wheezing and stridor.   Cardiovascular: Negative for chest pain, palpitations and leg swelling.  Gastrointestinal: Negative for abdominal distention.  Genitourinary: Negative for dysuria, urgency, frequency, hematuria, flank pain, decreased urine volume, difficulty urinating and dyspareunia.  Musculoskeletal: As in history of present illness  Neurological: Negative for dizziness, tremors, seizures, syncope, facial asymmetry, speech difficulty, weakness, light-headedness, numbness and headaches.  Hematological: Negative for adenopathy. Does not bruise/bleed easily.  Psychiatric/Behavioral: Negative for hallucinations, behavioral problems, confusion, dysphoric mood, decreased concentration and agitation.    Objective:   Filed Vitals:  08/02/13 1143  BP: 122/85  Pulse: 81  Temp: 98.3 F (36.8 C)  Resp: 14    Physical Exam  Constitutional: Appears well-developed and well-nourished. No distress.  HENT: Normocephalic. External right and left ear normal. Oropharynx is clear and moist.  Eyes: Conjunctivae and EOM are normal. PERRLA, no scleral icterus.  Neck: Normal ROM. Neck supple. No JVD. No tracheal deviation. No thyromegaly.  CVS: RRR, S1/S2 +, no murmurs, no gallops, no carotid bruit.  Pulmonary: Effort and breath  sounds normal, no stridor, rhonchi, wheezes, rales.  Abdominal: Soft. BS +,  no distension, tenderness, rebound or guarding.  5+/5+ to upper and lower extremities with resistance applied, equal distribution. Normal straight leg raise. Gait noted mild limp secondary to pain with applying pressure to the left leg. Negative step off. Negative drift. Proper heel to knee down shin bilaterally.  Skin: Skin is warm and dry.  Psychiatric: She has a normal mood and affect. Her behavior is normal.    Lab Results  Component Value Date   WBC 6.1 02/20/2011   HGB 9.7* 02/20/2011   HCT 28.8* 02/20/2011   MCV 88.9 02/20/2011   PLT 217 02/20/2011   Lab Results  Component Value Date   CREATININE 0.63 02/11/2011   BUN 14 02/11/2011   NA 135 02/11/2011   K 4.2 02/11/2011   CL 94* 02/11/2011   CO2 31 02/11/2011    Lab Results  Component Value Date   HGBA1C 6.2 07/23/2010   Lipid Panel     Component Value Date/Time   CHOL 140 03/05/2010 2123   TRIG 175* 03/05/2010 2123   HDL 51 03/05/2010 2123   CHOLHDL 2.7 Ratio 03/05/2010 2123   VLDL 35 03/05/2010 2123   LDLCALC 54 03/05/2010 2123       Assessment and plan:   Patient Active Problem List   Diagnosis Date Noted  . Thyroid cancer 03/08/2011  . GERD 10/06/2010  . CALLUSES, FEET, BILATERAL 07/23/2010  . COLONIC POLYPS, HX OF 04/24/2010  . UTI 03/05/2010  . DIABETES MELLITUS, MILD 12/20/2008  . BLOOD IN STOOL 12/20/2008  . HYPERTENSION 07/27/2003  . BENIGN NEOPLASM OF THYROID GLANDS 11/29/2001       Lumbosacral pain Increased paraspinal muscle spasm to the left of the lumbar spine. No signs of cauda equina Status post treatment with prednisone, currently taking Ultram and Robaxin which is helping On the patient have an MRI of   lumbar spine and sacrum Orthopedic referral for possible SI joint injection Followup in 2 weeks after the MRI   The patient was given clear instructions to go to ER or return to medical center if symptoms don't improve,  worsen or new problems develop. The patient verbalized understanding. The patient was told to call to get any lab results if not heard anything in the next week.

## 2013-08-02 NOTE — Progress Notes (Signed)
Pt is here for a hospital f/u for Lt leg pain. Burning sensation and cramping at the bottom of Lt calf and heel. Pt is feeling some discomfort.

## 2013-08-15 ENCOUNTER — Ambulatory Visit (HOSPITAL_COMMUNITY)
Admission: RE | Admit: 2013-08-15 | Discharge: 2013-08-15 | Disposition: A | Discharge status: Home / Self Care | Payer: No Typology Code available for payment source | Source: Ambulatory Visit | Attending: Internal Medicine | Admitting: Internal Medicine

## 2013-08-15 ENCOUNTER — Ambulatory Visit (HOSPITAL_COMMUNITY): Admission: RE | Admit: 2013-08-15 | Payer: No Typology Code available for payment source | Source: Ambulatory Visit

## 2013-08-15 DIAGNOSIS — M5126 Other intervertebral disc displacement, lumbar region: Secondary | ICD-10-CM | POA: Insufficient documentation

## 2013-08-15 DIAGNOSIS — M549 Dorsalgia, unspecified: Secondary | ICD-10-CM | POA: Insufficient documentation

## 2013-08-15 DIAGNOSIS — M25559 Pain in unspecified hip: Secondary | ICD-10-CM | POA: Insufficient documentation

## 2013-08-15 DIAGNOSIS — M79609 Pain in unspecified limb: Secondary | ICD-10-CM | POA: Insufficient documentation

## 2013-08-15 NOTE — Addendum Note (Signed)
Addended by: Allyson Sabal MD, Ascencion Dike on: 08/15/2013 02:59 PM   Modules accepted: Orders

## 2013-08-22 ENCOUNTER — Encounter: Payer: Self-pay | Admitting: Internal Medicine

## 2013-08-22 ENCOUNTER — Ambulatory Visit: Payer: No Typology Code available for payment source | Attending: Internal Medicine | Admitting: Internal Medicine

## 2013-08-22 VITALS — BP 128/89 | HR 75 | Temp 98.9°F | Resp 14 | Ht 60.0 in | Wt 124.6 lb

## 2013-08-22 DIAGNOSIS — I10 Essential (primary) hypertension: Secondary | ICD-10-CM | POA: Insufficient documentation

## 2013-08-22 DIAGNOSIS — Z85850 Personal history of malignant neoplasm of thyroid: Secondary | ICD-10-CM | POA: Insufficient documentation

## 2013-08-22 DIAGNOSIS — M549 Dorsalgia, unspecified: Secondary | ICD-10-CM

## 2013-08-22 LAB — CBC WITH DIFFERENTIAL/PLATELET
BASOS ABS: 0 10*3/uL (ref 0.0–0.1)
BASOS PCT: 0 % (ref 0–1)
Eosinophils Absolute: 0.1 10*3/uL (ref 0.0–0.7)
Eosinophils Relative: 2 % (ref 0–5)
HCT: 41.3 % (ref 36.0–46.0)
HEMOGLOBIN: 14 g/dL (ref 12.0–15.0)
Lymphocytes Relative: 33 % (ref 12–46)
Lymphs Abs: 1.5 10*3/uL (ref 0.7–4.0)
MCH: 30.9 pg (ref 26.0–34.0)
MCHC: 33.9 g/dL (ref 30.0–36.0)
MCV: 91.2 fL (ref 78.0–100.0)
MONOS PCT: 6 % (ref 3–12)
Monocytes Absolute: 0.3 10*3/uL (ref 0.1–1.0)
NEUTROS ABS: 2.7 10*3/uL (ref 1.7–7.7)
NEUTROS PCT: 59 % (ref 43–77)
PLATELETS: 329 10*3/uL (ref 150–400)
RBC: 4.53 MIL/uL (ref 3.87–5.11)
RDW: 13.9 % (ref 11.5–15.5)
WBC: 4.6 10*3/uL (ref 4.0–10.5)

## 2013-08-22 LAB — POCT GLYCOSYLATED HEMOGLOBIN (HGB A1C): Hemoglobin A1C: 5.8

## 2013-08-22 LAB — LIPID PANEL
CHOLESTEROL: 148 mg/dL (ref 0–200)
HDL: 52 mg/dL (ref >39)
LDL CALC: 55 mg/dL (ref 0–99)
TRIGLYCERIDES: 207 mg/dL — AB (ref <150)
Total CHOL/HDL Ratio: 2.8 Ratio
VLDL: 41 mg/dL — AB (ref 0–40)

## 2013-08-22 LAB — COMPLETE METABOLIC PANEL WITH GFR
ALBUMIN: 4.4 g/dL (ref 3.5–5.2)
ALK PHOS: 57 U/L (ref 39–117)
ALT: 36 U/L — AB (ref 0–35)
AST: 26 U/L (ref 0–37)
BUN: 14 mg/dL (ref 6–23)
CO2: 33 mEq/L — ABNORMAL HIGH (ref 19–32)
Calcium: 9.5 mg/dL (ref 8.4–10.5)
Chloride: 101 mEq/L (ref 96–112)
Creat: 0.6 mg/dL (ref 0.50–1.10)
GFR, Est African American: >89 mL/min
GFR, Est Non African American: >89 mL/min
Glucose, Bld: 98 mg/dL (ref 70–99)
POTASSIUM: 4.8 meq/L (ref 3.5–5.3)
SODIUM: 139 meq/L (ref 135–145)
TOTAL PROTEIN: 7.1 g/dL (ref 6.0–8.3)
Total Bilirubin: 0.6 mg/dL (ref 0.2–1.2)

## 2013-08-22 LAB — POCT URINALYSIS DIPSTICK
Bilirubin, UA: NEGATIVE
Glucose, UA: NEGATIVE
Ketones, UA: NEGATIVE
NITRITE UA: NEGATIVE
PH UA: 7
RBC UA: NEGATIVE
SPEC GRAV UA: 1.02
UROBILINOGEN UA: 0.2

## 2013-08-22 MED ORDER — TRIAMTERENE-HCTZ 37.5-25 MG PO TABS: 1.0000 | ORAL_TABLET | Freq: Every day | ORAL | Status: DC

## 2013-08-22 MED ORDER — TRAMADOL HCL 50 MG PO TABS: 50.0000 mg | ORAL_TABLET | Freq: Four times a day (QID) | ORAL | Status: DC | PRN

## 2013-08-22 MED ORDER — METHOCARBAMOL 500 MG PO TABS: 500.0000 mg | ORAL_TABLET | Freq: Two times a day (BID) | ORAL | Status: DC

## 2013-08-22 MED ORDER — LEVOTHYROXINE SODIUM 75 MCG PO TABS: 75.0000 ug | ORAL_TABLET | Freq: Every day | ORAL | Status: DC

## 2013-08-22 NOTE — Progress Notes (Addendum)
Patient ID: Jamie [name], female   DOB: 1956-04-08, 58 y.o.   MRN: [id number]   CC:  HPI: 58 year old female with history of diabetes, hypertension, papillary thyroid cancer, status post thyroidectomy 2012, on Synthroid who presents to the ER for followup of her back pain. The patient had an MRI on 1/21 of her lumbar spine and the sacrum and the patient is a moderately large central and left parasternal distribution at L5-S1. The patient does not complain of any localized pain in her lumbar spine but does complaint of left leg lower extremity numbness. She has an appointment with sports medicine on February 11. At this time the patient and the daughter cannot afford to see a neurosurgeon because of a 200$ copay  She also complains of urinary retention, urgency, dysuria, chills times one day No fever She has also been out of her antihypertensive medication for 2 weeks    No Known Allergies Past Medical History  Diagnosis Date  . Thyroid disease   . Hypertension   . Uterine enlargement   . Diabetes mellitus   . Osteopenia   . Arthritis   . Cancer     thyroid  . Uterine enlargement    Current Outpatient Prescriptions on File Prior to Visit  Medication Sig Dispense Refill  . [DISCONTINUED] lisinopril (PRINIVIL,ZESTRIL) 5 MG tablet Take 5 mg by mouth daily.         No current facility-administered medications on file prior to visit.   Family History  Problem Relation Age of Onset  . Cancer Father     lung   History   Social History  . Marital Status: Married    Spouse Name: N/A    Number of Children: N/A  . Years of Education: N/A   Occupational History  . Not on file.   Social History Main Topics  . Smoking status: Never Smoker   . Smokeless tobacco: Never Used  . Alcohol Use: No  . Drug Use: No  . Sexual Activity:    Other Topics Concern  . Not on file   Social History Narrative  . No narrative on file    Review of Systems  Constitutional: Negative for fever,  chills, diaphoresis, activity change, appetite change and fatigue.  HENT: Negative for ear pain, nosebleeds, congestion, facial swelling, rhinorrhea, neck pain, neck stiffness and ear discharge.   Eyes: Negative for pain, discharge, redness, itching and visual disturbance.  Respiratory: Negative for cough, choking, chest tightness, shortness of breath, wheezing and stridor.   Cardiovascular: Negative for chest pain, palpitations and leg swelling.  Gastrointestinal: Negative for abdominal distention.  Genitourinary: Negative for dysuria, urgency, frequency, hematuria, flank pain, decreased urine volume, difficulty urinating and dyspareunia.  Musculoskeletal: As in history of present illness Neurological: Negative for dizziness, tremors, seizures, syncope, facial asymmetry, speech difficulty, weakness, light-headedness, numbness and headaches.  Hematological: Negative for adenopathy. Does not bruise/bleed easily.  Psychiatric/Behavioral: Negative for hallucinations, behavioral problems, confusion, dysphoric mood, decreased concentration and agitation.    Objective:   Filed Vitals:   08/22/13 1041  BP: 128/89  Pulse: 75  Temp: 98.9 F (37.2 C)  Resp: 14    Physical Exam  Constitutional: Appears well-developed and well-nourished. No distress.  HENT: Normocephalic. External right and left ear normal. Oropharynx is clear and moist.  Eyes: Conjunctivae and EOM are normal. PERRLA, no scleral icterus.  Neck: Normal ROM. Neck supple. No JVD. No tracheal deviation. No thyromegaly.  CVS: RRR, S1/S2 +, no murmurs, no gallops, no carotid bruit.  Pulmonary: Effort and breath sounds normal, no stridor, rhonchi, wheezes, rales.  Abdominal: Soft. BS +,  no distension, tenderness, rebound or guarding.  Musculoskeletal: Normal range of motion. No edema and no tenderness.  Lymphadenopathy: No lymphadenopathy noted, cervical, inguinal. Neuro: Alert. Normal reflexes, muscle tone coordination. No cranial  nerve deficit. Skin: Skin is warm and dry. No rash noted. Not diaphoretic. No erythema. No pallor.  Psychiatric: Normal mood and affect. Behavior, judgment, thought content normal.   Lab Results  Component Value Date   WBC 6.1 02/20/2011   HGB 9.7* 02/20/2011   HCT 28.8* 02/20/2011   MCV 88.9 02/20/2011   PLT 217 02/20/2011   Lab Results  Component Value Date   CREATININE 0.63 02/11/2011   BUN 14 02/11/2011   NA 135 02/11/2011   K 4.2 02/11/2011   CL 94* 02/11/2011   CO2 31 02/11/2011    Lab Results  Component Value Date   HGBA1C 5.9 08/02/2013   Lipid Panel     Component Value Date/Time   CHOL 140 03/05/2010 2123   TRIG 175* 03/05/2010 2123   HDL 51 03/05/2010 2123   CHOLHDL 2.7 Ratio 03/05/2010 2123   VLDL 35 03/05/2010 2123   LDLCALC 54 03/05/2010 2123       Assessment and plan:   Patient Active Problem List   Diagnosis Date Noted  . Thyroid cancer 03/08/2011  . GERD 10/06/2010  . CALLUSES, FEET, BILATERAL 07/23/2010  . COLONIC POLYPS, HX OF 04/24/2010  . UTI 03/05/2010  . DIABETES MELLITUS, MILD 12/20/2008  . BLOOD IN STOOL 12/20/2008  . HYPERTENSION 07/27/2003  . 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 Follows with Dr Ninfa Linden annually TSH  Urinary urgency Urine dipstick, rule out diabetes  Follow up in 2 months   The patient was given clear instructions to go to ER or return to medical center if symptoms don't improve, worsen or new problems develop. The patient verbalized understanding. The patient was told to call to get any lab results if not heard anything in the next week.

## 2013-08-22 NOTE — Addendum Note (Signed)
Addended by: Allyson Sabal MD, Ascencion Dike on: 08/22/2013 11:08 AM   Modules accepted: Level of Service

## 2013-08-22 NOTE — Progress Notes (Signed)
Pt is here for an office visit and a HTN medication refill. Has been off medication x2 weeks. Complains of pain while urination, more of discomfort. Pt feels the urge to urinate, but can't. Pt's daughter is her interpreter.

## 2013-08-23 LAB — VITAMIN D 25 HYDROXY (VIT D DEFICIENCY, FRACTURES): Vit D, 25-Hydroxy: 18 ng/mL — ABNORMAL LOW (ref 30–89)

## 2013-08-23 LAB — TSH: TSH: 0.279 u[IU]/mL — ABNORMAL LOW (ref 0.350–4.500)

## 2013-08-24 ENCOUNTER — Telehealth: Payer: Self-pay | Admitting: *Deleted

## 2013-08-24 NOTE — Telephone Encounter (Signed)
Contacted pt with an interpreter and left a voicemail for pt to give [place] a call back.

## 2013-08-24 NOTE — Telephone Encounter (Signed)
Message copied by Cori Justus, [place] R on Fri Aug 24, 2013  3:11 PM ------      Message from: Allyson Sabal MD, [facility]      Created: Thu Aug 23, 2013  9:23 AM       Notify patient of the TSH is low, please schedule her for a free T4            Notify patient of vitamin D. level is 18, advised patient to start vitamin D 2000 international units over-the-counter 3 times a day ------

## 2013-09-05 ENCOUNTER — Ambulatory Visit (INDEPENDENT_AMBULATORY_CARE_PROVIDER_SITE_OTHER): Payer: No Typology Code available for payment source | Admitting: Family Medicine

## 2013-09-05 VITALS — BP 124/86 | HR 92 | Ht 60.0 in | Wt 124.0 lb

## 2013-09-05 DIAGNOSIS — IMO0002 Reserved for concepts with insufficient information to code with codable children: Secondary | ICD-10-CM

## 2013-09-05 DIAGNOSIS — M5416 Radiculopathy, lumbar region: Secondary | ICD-10-CM

## 2013-09-05 NOTE — Progress Notes (Signed)
CC: Resolved low back and left leg pain HPI: Patient is a pleasant 58 year old female who presents for resolved low back and left leg pain. She states that unfortunately since her referral was placed her pain has now resolved. She previously was having low back pain that radiated down her leg for 4 months. She did have a lumbar spine MRI that showed a substantial disc bulge L5-S1 impinging on the S1 nerve root. She was therefore referred to [place]. However she has gotten better since that time and has stopped the tramadol that she was previously taking twice a day. Previously her pain was in her low back and radiated down her left lateral thigh to her posterior calf and bottom of the foot. She did have associated numbness and tingling but never had any weakness or bowel or bladder symptoms. Since her pain has resolved she has been off of tramadol for the last month and has had no further pain, numbness or tingling, weakness, or bowel or bladder symptoms.  ROS: As above in the HPI. All other systems are stable or negative.  OBJECTIVE: APPEARANCE:  Patient in no acute distress.The patient appeared well nourished and normally developed. HEENT: No scleral icterus. Conjunctiva non-injected Resp: Non labored Skin: No rash MSK:  Low back exam: - Full range of motion in flexion, extension, lateral bending, rotation without pain  - No tenderness to palpation over the spinous processes of the lumbar vertebra - No tenderness to palpation at the SI joint or sciatic notch - Negative straight leg raise - Strength 5 out of 5 in the bilateral lower extremities - Reflexes 2+ bilaterally.   MSK [place]: Not performed MRI: MRI of the lumbar spine was personally reviewed today. This shows a substantial herniated disc at L5-S1 with compression of the left S1 nerve root   ASSESSMENT: #1. Resolved left S1 radiculopathy secondary to disc herniation   PLAN: Offered reassurance to the patient. Despite her MRI her  symptoms have now completely resolved. There is no indication for surgery given that her pain is resolved and she has a normal physical exam today. I suspect that her body has resorbed the disc fragment. We will see her back as needed if her symptoms return such as weakness, pain, bowel or bladder symptoms.

## 2013-09-05 NOTE — Patient Instructions (Signed)
Thank you for coming in today  You previously had a large bulging disk in your back that was pressing on a nerve in your back causing pain down your leg This is now better You do not need surgery Return if your pain comes back or if you get any weakness or trouble controlling your urine  Followup as needed.

## 2013-10-22 ENCOUNTER — Encounter: Payer: Self-pay | Admitting: Internal Medicine

## 2013-10-22 ENCOUNTER — Ambulatory Visit: Payer: No Typology Code available for payment source | Attending: Internal Medicine | Admitting: Internal Medicine

## 2013-10-22 VITALS — BP 124/81 | HR 88 | Temp 98.5°F | Resp 16 | Ht 60.0 in | Wt 126.0 lb

## 2013-10-22 DIAGNOSIS — E559 Vitamin D deficiency, unspecified: Secondary | ICD-10-CM | POA: Insufficient documentation

## 2013-10-22 DIAGNOSIS — E039 Hypothyroidism, unspecified: Secondary | ICD-10-CM | POA: Insufficient documentation

## 2013-10-22 DIAGNOSIS — M549 Dorsalgia, unspecified: Secondary | ICD-10-CM | POA: Insufficient documentation

## 2013-10-22 DIAGNOSIS — I10 Essential (primary) hypertension: Secondary | ICD-10-CM

## 2013-10-22 MED ORDER — VITAMIN D (ERGOCALCIFEROL) 1.25 MG (50000 UNIT) PO CAPS: 50000.0000 [IU] | ORAL_CAPSULE | ORAL | Status: DC

## 2013-10-22 MED ORDER — TRIAMTERENE-HCTZ 37.5-25 MG PO TABS: 1.0000 | ORAL_TABLET | Freq: Every day | ORAL | Status: DC

## 2013-10-22 NOTE — Progress Notes (Signed)
Pt is here following up on her back pain. Today her back is improving but after walking or standing too long she begins to have more pain. Pt's daughter is interpreting for her today.

## 2013-10-22 NOTE — Progress Notes (Signed)
Patient ID: Jamie [name], female   DOB: Jun 18, 1956, 58 y.o.   MRN: [id number]   Jamie [name], is a 58 y.o. female  [id number]  [id number]  DOB - Dec 29, 1955  Chief Complaint  Patient presents with  . Follow-up        Subjective:   Jamie [name] is a 58 y.o. female here today for a follow up visit. Patient has history of diabetes, hypertension, papillary thyroid cancer, status post thyroidectomy 2012, on Synthroid and chronic back pain here today for followup and refill on his antihypertensive. He will also like to review the results of previous laboratory tests. He has no significant complaint today except for the back pain. His previous TSH was very low but he continues to take the same dose of levothyroxine. His blood pressure is well controlled. Has not yet started vitamin D supplement for his hypovitaminosis D. he does not smoke cigarette, he does not drink alcohol. He is here today with his daughter who is the interpreter and caregiver. Patient has No headache, No chest pain, No abdominal pain - No Nausea, No new weakness tingling or numbness, No Cough - SOB.  Problem  Back Pain  Unspecified Hypothyroidism  Htn (Hypertension)  Hypovitaminosis D    ALLERGIES: No Known Allergies  PAST MEDICAL HISTORY: Past Medical History  Diagnosis Date  . Thyroid disease   . Hypertension   . Uterine enlargement   . Diabetes mellitus   . Osteopenia   . Arthritis   . Cancer     thyroid  . Uterine enlargement     MEDICATIONS AT HOME: Prior to Admission medications   Medication Sig Start Date End Date Taking? Authorizing Provider  levothyroxine (SYNTHROID, LEVOTHROID) 75 MCG tablet Take 1 tablet (75 mcg total) by mouth daily. 08/22/13  Yes Reyne Dumas, MD  triamterene-hydrochlorothiazide (MAXZIDE-25) 37.5-25 MG per tablet Take 1 tablet by mouth daily. 10/22/13  Yes Angelica Chessman, MD  methocarbamol (ROBAXIN) 500 MG tablet Take 1 tablet (500 mg total) by mouth 2 (two) times daily. 08/22/13   Reyne Dumas, MD  traMADol (ULTRAM) 50 MG tablet Take 1 tablet (50 mg total) by mouth every 6 (six) hours as needed. 08/22/13   Reyne Dumas, MD  Vitamin D, Ergocalciferol, (DRISDOL) 50000 UNITS CAPS capsule Take 1 capsule (50,000 Units total) by mouth every 7 (seven) days. 10/22/13   Angelica Chessman, MD     Objective:   Filed Vitals:   10/22/13 1044  BP: 124/81  Pulse: 88  Temp: 98.5 F (36.9 C)  TempSrc: Oral  Resp: 16  Height: 5' (1.524 m)  Weight: 126 lb (57.153 kg)  SpO2: 94%    Exam General appearance : Awake, alert, not in any distress. Speech Clear. Not toxic looking HEENT: Atraumatic and Normocephalic, pupils equally reactive to light and accomodation Neck: supple, no JVD. No cervical lymphadenopathy.  Chest:Good air entry bilaterally, no added sounds  CVS: S1 S2 regular, no murmurs.  Abdomen: Bowel sounds present, Non tender and not distended with no gaurding, rigidity or rebound. Extremities: B/L Lower Ext shows no edema, both legs are warm to touch Neurology: Awake alert, and oriented X 3, CN II-XII intact, Non focal Skin:No Rash Wounds:N/A  Data Review Lab Results  Component Value Date   HGBA1C 5.8 08/22/2013   HGBA1C 5.9 08/02/2013   HGBA1C 6.2 07/23/2010     Assessment & Plan   1. Back pain Continue tramadol as needed for pain  2. Unspecified hypothyroidism Previous TSH level was very low,  instructed patient to cut down on a dose of levothyroxine to half for the next 2 months, will check TSH again  3. HTN (hypertension) Refill - triamterene-hydrochlorothiazide (MAXZIDE-25) 37.5-25 MG per tablet; Take 1 tablet by mouth daily.  Dispense: 90 tablet; Refill: 3  4. Hypovitaminosis D Prescribed - Vitamin D, Ergocalciferol, (DRISDOL) 50000 UNITS CAPS capsule; Take 1 capsule (50,000 Units total) by mouth every 7 (seven) days.  Dispense: 12 capsule; Refill: 0  Patient was counseled extensively on nutrition and exercise  Interpreter was used to communicate  directly with patient for the entire encounter including providing detailed patient instructions.    Return in about 3 months (around 01/22/2014), or if symptoms worsen or fail to improve, for Hypothyroidism, Follow up HTN.  The patient was given clear instructions to go to ER or return to medical center if symptoms don't improve, worsen or new problems develop. The patient verbalized understanding. The patient was told to call to get lab results if they haven't heard anything in the next week.   This note has been created with Surveyor, quantity. Any transcriptional errors are unintentional.    Angelica Chessman, MD, [place], [place], [place] and [facility] [place], [place]   10/22/2013, 11:26 AM

## 2013-11-15 ENCOUNTER — Ambulatory Visit: Payer: No Typology Code available for payment source | Attending: Internal Medicine | Admitting: Internal Medicine

## 2013-11-15 ENCOUNTER — Encounter: Payer: Self-pay | Admitting: Internal Medicine

## 2013-11-15 VITALS — BP 132/85 | HR 77 | Temp 98.0°F | Resp 16 | Ht 60.0 in | Wt 125.0 lb

## 2013-11-15 DIAGNOSIS — Z09 Encounter for follow-up examination after completed treatment for conditions other than malignant neoplasm: Secondary | ICD-10-CM | POA: Insufficient documentation

## 2013-11-15 DIAGNOSIS — E0500 Thyrotoxicosis with diffuse goiter without thyrotoxic crisis or storm: Secondary | ICD-10-CM | POA: Insufficient documentation

## 2013-11-15 DIAGNOSIS — M109 Gout, unspecified: Secondary | ICD-10-CM

## 2013-11-15 DIAGNOSIS — I10 Essential (primary) hypertension: Secondary | ICD-10-CM

## 2013-11-15 DIAGNOSIS — E119 Type 2 diabetes mellitus without complications: Secondary | ICD-10-CM | POA: Insufficient documentation

## 2013-11-15 LAB — CBC WITH DIFFERENTIAL/PLATELET
BASOS ABS: 0 10*3/uL (ref 0.0–0.1)
Basophils Relative: 0 % (ref 0–1)
Eosinophils Absolute: 0.1 10*3/uL (ref 0.0–0.7)
Eosinophils Relative: 2 % (ref 0–5)
HCT: 45.1 % (ref 36.0–46.0)
Hemoglobin: 15.9 g/dL — ABNORMAL HIGH (ref 12.0–15.0)
LYMPHS ABS: 1.9 10*3/uL (ref 0.7–4.0)
Lymphocytes Relative: 26 % (ref 12–46)
MCH: 30.8 pg (ref 26.0–34.0)
MCHC: 35.3 g/dL (ref 30.0–36.0)
MCV: 87.2 fL (ref 78.0–100.0)
Monocytes Absolute: 0.4 10*3/uL (ref 0.1–1.0)
Monocytes Relative: 5 % (ref 3–12)
NEUTROS ABS: 4.8 10*3/uL (ref 1.7–7.7)
Neutrophils Relative %: 67 % (ref 43–77)
PLATELETS: 361 10*3/uL (ref 150–400)
RBC: 5.17 MIL/uL — ABNORMAL HIGH (ref 3.87–5.11)
RDW: 13.6 % (ref 11.5–15.5)
WBC: 7.2 10*3/uL (ref 4.0–10.5)

## 2013-11-15 LAB — URIC ACID: URIC ACID, SERUM: 10.7 mg/dL — AB (ref 2.4–7.0)

## 2013-11-15 MED ORDER — TRIAMTERENE-HCTZ 37.5-25 MG PO TABS: 1.0000 | ORAL_TABLET | Freq: Every day | ORAL | Status: DC

## 2013-11-15 MED ORDER — COLCHICINE 0.6 MG PO TABS: 0.6000 mg | ORAL_TABLET | Freq: Every day | ORAL | Status: DC

## 2013-11-15 MED ORDER — INDOMETHACIN 50 MG PO CAPS: 50.0000 mg | ORAL_CAPSULE | Freq: Two times a day (BID) | ORAL | Status: DC

## 2013-11-15 NOTE — Progress Notes (Signed)
Pt is here having a very painful gout flair. Pt's son is here as an Veterinary surgeon.

## 2013-11-15 NOTE — Progress Notes (Signed)
Patient ID: Jamie [name], female   DOB: November 10, 1955, 58 y.o.   MRN: [id number]   Jamie [name], is a 58 y.o. female  [id number]  [id number]  DOB - [birth year]  Chief Complaint  Patient presents with  . Follow-up        Subjective:   Jamie [name] is a 58 y.o. female here today for a follow up visit. Patient is known to have hypertension, diabetes, and thyroid disease came in today because of pain and swelling of left big toe which started a few days ago. She initially thought it will resolve spontaneously but is getting worse. She denies any history of trauma, no history of fall. She thinks she has gout flareup because she has had this before. Patient has No headache, No chest pain, No abdominal pain - No Nausea, No new weakness tingling or numbness, No Cough - SOB.  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 Thyroid disease   . Hypertension   . Uterine enlargement   . Diabetes mellitus   . Osteopenia   . Arthritis   . Cancer     thyroid  . Uterine enlargement     MEDICATIONS AT HOME: Prior to Admission medications   Medication Sig Start Date End Date Taking? Authorizing Provider  acetaminophen (TYLENOL) 500 MG tablet Take 500 mg by mouth every 6 (six) hours as needed.   Yes Historical Provider, MD  levothyroxine (SYNTHROID, LEVOTHROID) 75 MCG tablet Take 1 tablet (75 mcg total) by mouth daily. 08/22/13  Yes Reyne Dumas, MD  triamterene-hydrochlorothiazide (MAXZIDE-25) 37.5-25 MG per tablet Take 1 tablet by mouth daily. 11/15/13  Yes Angelica Chessman, MD  Vitamin D, Ergocalciferol, (DRISDOL) 50000 UNITS CAPS capsule Take 1 capsule (50,000 Units total) by mouth every 7 (seven) days. 10/22/13  Yes Angelica Chessman, MD  colchicine 0.6 MG tablet Take 1 tablet (0.6 mg total) by mouth daily. 11/15/13   Angelica Chessman, MD  indomethacin (INDOCIN) 50 MG capsule Take 1 capsule (50 mg total) by  mouth 2 (two) times daily with a meal. 11/15/13   Angelica Chessman, MD  methocarbamol (ROBAXIN) 500 MG tablet Take 1 tablet (500 mg total) by mouth 2 (two) times daily. 08/22/13   Reyne Dumas, MD  traMADol (ULTRAM) 50 MG tablet Take 1 tablet (50 mg total) by mouth every 6 (six) hours as needed. 08/22/13   Reyne Dumas, MD     Objective:   Filed Vitals:   11/15/13 1105  BP: 132/85  Pulse: 77  Temp: 98 F (36.7 C)  TempSrc: Oral  Resp: 16  Height: 5' (1.524 m)  Weight: 125 lb (56.7 kg)  SpO2: 99%    Exam General appearance : Awake, alert, not in any distress. Speech Clear. Not toxic looking HEENT: Atraumatic and Normocephalic, pupils equally reactive to light and accomodation Neck: supple, no JVD. No cervical lymphadenopathy.  Chest:Good air entry bilaterally, no added sounds  CVS: S1 S2 regular, no murmurs.  Abdomen: Bowel sounds present, Non tender and not distended with no gaurding, rigidity or rebound. Extremities: B/L Lower Ext shows no edema, both legs are warm to touch. Swelling, red tender left big toe Neurology: Awake alert, and oriented X 3, CN II-XII intact, Non focal Skin:No Rash Wounds:N/A  Data Review Lab Results  Component Value Date   HGBA1C 5.8 08/22/2013   HGBA1C 5.9 08/02/2013   HGBA1C 6.2 07/23/2010  Assessment & Plan   1. HTN (hypertension)  - triamterene-hydrochlorothiazide (MAXZIDE-25) 37.5-25 MG per tablet; Take 1 tablet by mouth daily.  Dispense: 90 tablet; Refill: 3  2. Gouty arthritis of toe of left foot  - colchicine 0.6 MG tablet; Take 1 tablet (0.6 mg total) by mouth daily.  Dispense: 30 tablet; Refill: 1 - indomethacin (INDOCIN) 50 MG capsule; Take 1 capsule (50 mg total) by mouth 2 (two) times daily with a meal.  Dispense: 30 capsule; Refill: 0  Lab - Uric Acid - CBC with Differential  Patient was extensively counseled on nutrition and exercise  Return in about 6 months (around 05/17/2014), or if symptoms worsen or fail to  improve, for Follow up Pain and comorbidities, Follow up HTN.  The patient was given clear instructions to go to ER or return to medical center if symptoms don't improve, worsen or new problems develop. The patient verbalized understanding. The patient was told to call to get lab results if they haven't heard anything in the next week.   This note has been created with Surveyor, quantity. Any transcriptional errors are unintentional.    Angelica Chessman, MD, [place], [place], [place] and [facility] [place], [place]   11/15/2013, 11:35 AM

## 2013-11-15 NOTE — Patient Instructions (Signed)
B?nh gt (Gout) Gt l tnh tr?ng vim kh?p gy ra b?i s? tch t? tinh th? axit uric trong kh?p. Axit uric l m?t ch?t ha h?c th??ng hi?n di?n trong mu. Khi n?ng ?? axit uric trong mu qu cao, n c th? t?o [place] th? tch t? trong kh?p v m c?a b?n. ?i?u ny gy ra t?y ??, ?au nh?c v s?ng kh?p (vim). Cc c?n gt l?p l?i l ph? bi?n. Theo th?i gian, cc tinh th? axit uric c th? t?o thnh cc c?c (h?t tophi) g?n kh?p, ph h?y x??ng v gy bi?n d?ng. Gt c th? ?i?u tr? ???c v th??ng c th? ng?n ng?a ???c.  NGUYN NHN C?n b?nh ny b?t ??u v?i n?ng ?? axit uric trong mu t?ng. Axit uric ???c t?o ra b?i c? th? khi ph v? m?t ch?t ???c tm th?y trong t? nhin c tn l purin. M?t s? lo?i th?c ph?m nh?t ??nh m b?n ?n, ch?ng h?n nh? th?t v c, c ch?a m?t l??ng l?n ch?t purin. Nguyn nhn lm cho n?ng ?? axit uric cao bao g?m:  Truy?n t? cha m? sang con (di truy?n).  B?nh lm t?ng s?n sinh axit uric (ch?ng h?n nh? bo ph, b?nh v?y n?n v m?t s? b?nh ung th? nh?t ??nh).  S? d?ng r??u qu m?c.  Ch? ?? ?n, ??c bi?t l ch? ?? ?n nhi?u th?t v h?i s?n.  Thu?c, bao g?m m?t s? lo?i thu?c ch?ng ung th? nh?t ??nh (ha tr? li?u), thu?c l?i ti?u v aspirin.  B?nh th?n m?n tnh. Th?n khng th? lo?i b? h?t axit uric ???c n?a.  V?n ?? v? chuy?n ha. Cc tnh tr?ng c lin quan ch?t ch? v?i b?nh gt bao g?m:  Bo ph.  Huy?t p cao.  Cholesterol cao.  Ti?u ???ng. Khng ph?i t?t c? m?i ng??i c n?ng ?? axit uric cao ??u b? b?nh gt. Khng th? gi?i thch v sao m?t s? ng??i b? gt cn nh?ng ng??i khc l?i khng b?. Ph?u thu?t, ch?n th??ng kh?p v ?n qu nhi?u m?t s? lo?i th?c ph?m nh?t ??nh l m?t s? y?u t? c th? d?n ??n c?n gt c?p. TRI?U CH?NG  C?n gt c?p xu?t hi?n nhanh chng. B?nh gy ?au ??n d? d?i km theo t?y ??, s?ng v ?m ? m?t kh?p.  C th? b? s?t.  Thng th??ng, ch? l m?t kh?p b? ?au. M?t s? kh?p nh?t ??nh th??ng b? ?au:  N?n ngn chn ci.  ??u g?i.  C? chn.  C?  tay.  Ngn tay. N?u khng ?i?u tr?, c?n gt c?p th??ng bi?n m?t trong m?t vi ngy ??n vi tu?n. Gi?a cc c?n gt c?p, b?n th??ng s? khng c cc tri?u ch?ng, cc tri?u ch?ng th??ng khc v?i cc d?ng vim kh?p khc. CH?N ?ON Chuyn gia ch?m [place] s?c kh?e s? nghi ng? b?nh gt d?a trn cc tri?u ch?ng v vi?c khm. Trong m?t s? tr??ng h?p, b?n c th? c?n xt nghi?m. Cc xt nghi?m c th? bao g?m:  Xt nghi?m mu.  Xt nghi?m n??c ti?u.  Ch?p X quang.  Xt nghi?m d?ch kh?p. Xt nghi?m ny c?n m?t cy kim ?? ht d?ch ra kh?i kh?p (ch?c kh?p). B?ng cch s? d?ng knh hi?n vi, b?nh gt ???c xc nh?n khi tinh th? axit uric ???c pht hi?n trong d?ch kh?p. ?I?U TR? C hai giai ?o?n ?i?u tr? b?nh gt: ?i?u tr? c?n gt c?p kh?i pht ??t ng?t (c?p tnh) v ng?n ch?n c?n gt  c?p (d? phng).  ?i?u Tr? C?n Gt C?p.  Thu?c ???c s? d?ng. Cc lo?i thu?c ny bao g?m thu?c ch?ng vim ho?c thu?c steroid.  ?i khi c?n tim thu?c steroid vo kh?p b? ?au.  Kh?p ?au ???c ngh? ng?i. V?n ??ng c th? lm tr?m tr?ng thm b?nh vim kh?p.  B?n c th? s? d?ng ph??ng php ?i?u tr? ?m ho?c l?nh trn kh?p b? ?au, ty thu?c ph??ng php no ph h?p nh?t v?i b?n.  ?i?u Tr? ?? Ng?n Ch?n Cc C?n Gt C?p.  N?u b?n th??ng xuyn b? cc c?n gt c?p, chuyn gia ch?m sc s?c kh?e c th? t? v?n cho b?n s? d?ng thu?c d? phng. Nh?ng thu?c ny ???c b?t ??u sau khi c?n gt c?p ? ??. Nh?ng lo?i thu?c ny c gip th?n lo?i b? axit uric ra kh?i c? th? c?a b?n ho?c gi?m s?n xu?t axit uric. B?n c th? c?n ti?p t?c s? d?ng cc lo?i thu?c ny trong m?t th?i gian r?t di.  Giai ?o?n ?i?u tr? ban ??u b?ng thu?c phng ng?a c th? k?t h?p v?i s? gia t?ng cc c?n gt c?p. V l do ny, trong nh?ng thng ??u ?i?u tr?, chuyn gia ch?m sc s?c kh?e c?ng c th? t? v?n cho b?n dng cc lo?i thu?c th??ng ???c s? d?ng ?? ?i?u tr? b?nh gt c?p tnh. ??m b?o b?n hi?u h??ng d?n c?a chuyn gia ch?m sc s?c kh?e. Chuyn gia ch?m sc s?c kh?e c th? th?c hi?n  m?t s? ?i?u ch?nh li?u thu?c c?a b?n tr??c khi nh?ng thu?c ny c hi?u qu?.  Th?o lu?n v? ?i?u tr? b?ng ch? ?? ?n u?ng v?i chuyn gia ch?m sc s?c kh?e ho?c chuyn gia dinh d??ng c?a b?n. R??u v ?? u?ng ch?a nhi?u ???ng v fructoza v cc th?c ph?m nh? th?t, gia c?m v h?i s?n c th? lm t?ng n?ng ?? axit uric. Chuyn gia ch?m sc s?c kh?e ho?c chuyn gia dinh d??ng c?a b?n c th? t? v?n cho b?n v? ?? u?ng v th?c ph?m no c?n ph?i h?n ch?. H??NG D?N CH?M SC T?I NH  Khng s? d?ng aspirin ?? gi?m ?au. Aspirin lm t?ng n?ng ?? axit uric.  Ch? s? d?ng thu?c khng c?n k toa ho?c thu?c c?n k toa ?? gi?m ?au, gi?m c?m gic kh ch?u ho?c h? s?t theo ch? d?n c?a chuyn gia ch?m sc s?c kh?e c?a b?n.  ?? kh?p ? ngh? ng?i cng nhi?u cng t?t. Khi ? trn gi??ng, gi? cho kh?n tr?i gi??ng v ch?n trnh xa nh?ng ch? b? ?au.  Gi? cho kh?p b? ?au ? trn cao (gi? cao).  Ch??m ?m ho?c l?nh vo kh?p b? ?au. S? d?ng ph??ng php ?i?u tr? ?m ho?c l?nh ty thu?c vo ph??ng php no ph h?p nh?t v?i b?n.  S? d?ng n?ng n?u kh?p b? ?au ? chn b?n.  U?ng ?? n??c ?? gi? cho n??c ti?u trong ho?c vng nh?t. ?i?u ny gip c? th? b?n lo?i b? axit uric. H?n ch? r??u, ?? u?ng c ???ng v ?? u?ng c fructoza.  Th?c hi?n theo h??ng d?n v? ch? ?? ?n u?ng. Hy ch  c?n th?n ??n l??ng prtein b?n ?n. Ch? ?? ?n u?ng hng ngy c?a b?n nn t?p trung vo tri cy, rau, ng? c?c nguyn h?t v nh?ng s?n ph?m s?a khng c ch?t bo ho?c t ch?t bo. Th?o lu?n v? vi?c s? d?ng c ph, vitamin C v anh ?o v?i chuyn gia ch?m sc s?c kh?e   v chuyn gia dinh d??ng c?a b?n. Chng c th? gip lm gi?m n?ng ?? axit uric.  Duy tr tr?ng l??ng c? th? kh?e m?nh. HY ?I KHM N?U:  B?n b? tiu ch?y, nn m?a ho?c b?t k? tc d?ng ph? no t? thu?c.  B?n khng c?m th?y kh h?n sau 24 gi?, ho?c b?n th?y t? h?n. HY NGAY L?P T?C ?I KHM N?U:  Kh?p c?a b?n ??t nhin tr? nn nh?y c?m ?au h?n v b?n b? ?n l?nh ho?c s?t. ??M B?O B?N:  Hi?u cc  h??ng d?n ny.  S? theo di tnh tr?ng c?a mnh.  S? yu c?u tr? gip ngay l?p t?c n?u b?n c?m th?y khng ?? ho?c tnh tr?ng tr?m tr?ng h?n. Document Released: 04/21/2005 Document Revised: 03/14/2013 [facility] Patient Information 2014 [place], [place].

## 2013-11-29 ENCOUNTER — Telehealth: Payer: Self-pay | Admitting: Emergency Medicine

## 2013-11-29 NOTE — Telephone Encounter (Signed)
Message copied by Ricci Barker on Thu Nov 29, 2013 12:56 PM ------      Message from: Angelica Chessman E      Created: Wed Nov 28, 2013  3:52 PM       Please inform patient that her uric acid level is slightly high but other lab results are within normal limit. After this acute phase of gouty flare up, we will start her on allopurinol tablet daily to prevent subsequent flare up. Please ask patient how she is doing in regards to her pain and swelling ------

## 2013-11-29 NOTE — Telephone Encounter (Signed)
Left message for pt to return cALL WHEN MESSAGE RECEIVED

## 2014-01-22 ENCOUNTER — Ambulatory Visit: Payer: No Typology Code available for payment source | Attending: Internal Medicine | Admitting: Internal Medicine

## 2014-01-22 ENCOUNTER — Encounter: Payer: Self-pay | Admitting: Internal Medicine

## 2014-01-22 VITALS — BP 131/86 | HR 82 | Temp 98.5°F | Resp 16 | Ht 60.0 in | Wt 125.0 lb

## 2014-01-22 DIAGNOSIS — R7303 Prediabetes: Secondary | ICD-10-CM

## 2014-01-22 DIAGNOSIS — E119 Type 2 diabetes mellitus without complications: Secondary | ICD-10-CM

## 2014-01-22 DIAGNOSIS — E1165 Type 2 diabetes mellitus with hyperglycemia: Secondary | ICD-10-CM

## 2014-01-22 DIAGNOSIS — I10 Essential (primary) hypertension: Secondary | ICD-10-CM

## 2014-01-22 DIAGNOSIS — E039 Hypothyroidism, unspecified: Secondary | ICD-10-CM

## 2014-01-22 DIAGNOSIS — R7309 Other abnormal glucose: Secondary | ICD-10-CM

## 2014-01-22 DIAGNOSIS — M109 Gout, unspecified: Secondary | ICD-10-CM

## 2014-01-22 LAB — GLUCOSE, POCT (MANUAL RESULT ENTRY): POC GLUCOSE: 190 mg/dL — AB (ref 70–99)

## 2014-01-22 LAB — POCT GLYCOSYLATED HEMOGLOBIN (HGB A1C): HEMOGLOBIN A1C: 6.6

## 2014-01-22 MED ORDER — METFORMIN HCL 500 MG PO TABS: 500.0000 mg | ORAL_TABLET | Freq: Two times a day (BID) | ORAL | Status: DC

## 2014-01-22 MED ORDER — TRIAMTERENE-HCTZ 37.5-25 MG PO TABS: 1.0000 | ORAL_TABLET | Freq: Every day | ORAL | Status: DC

## 2014-01-22 MED ORDER — LEVOTHYROXINE SODIUM 75 MCG PO TABS: 75.0000 ug | ORAL_TABLET | Freq: Every day | ORAL | Status: DC

## 2014-01-22 MED ORDER — INDOMETHACIN 50 MG PO CAPS: 50.0000 mg | ORAL_CAPSULE | Freq: Two times a day (BID) | ORAL | Status: DC

## 2014-01-22 MED ORDER — ALLOPURINOL 300 MG PO TABS: 300.0000 mg | ORAL_TABLET | Freq: Every day | ORAL | Status: DC

## 2014-01-22 NOTE — Progress Notes (Signed)
Patient ID: Jamie [name], female   DOB: April 02, 1956, 58 y.o.   MRN: [id number]   Jamie [name], is a 58 y.o. female  [id number]  [id number]  DOB - [birth year]  Chief Complaint  Patient presents with  . Follow-up        Subjective:   Jamie [name] is a 58 y.o. female here today for a follow up visit. Patient is known to have hypertension, hypothyroidism, prediabetes, gout and osteoarthritis, here today for followup. She has no significant complaint. She needs refill on some of her medications. She is also due for repeat of her thyroid function test. Flared up gouty arthritis has resolved completely. Her blood pressure is controlled, she reports no side effects to medications. She does not smoke cigarettes she does not drink alcohol. Patient has No headache, No chest pain, No abdominal pain - No Nausea, No new weakness tingling or numbness, No Cough - SOB.  Problem  Type 2 Diabetes Mellitus Without Complication    ALLERGIES: No Known Allergies  PAST MEDICAL HISTORY: Past Medical History  Diagnosis Date  . Thyroid disease   . Hypertension   . Uterine enlargement   . Diabetes mellitus   . Osteopenia   . Arthritis   . Cancer     thyroid  . Uterine enlargement     MEDICATIONS AT HOME: Prior to Admission medications   Medication Sig Start Date End Date Taking? Authorizing Provider  levothyroxine (SYNTHROID, LEVOTHROID) 75 MCG tablet Take 1 tablet (75 mcg total) by mouth daily. 01/22/14  Yes Angelica Chessman, MD  acetaminophen (TYLENOL) 500 MG tablet Take 500 mg by mouth every 6 (six) hours as needed.    Historical Provider, MD  allopurinol (ZYLOPRIM) 300 MG tablet Take 1 tablet (300 mg total) by mouth daily. 01/22/14   Angelica Chessman, MD  colchicine 0.6 MG tablet Take 1 tablet (0.6 mg total) by mouth daily. 11/15/13   Angelica Chessman, MD  indomethacin (INDOCIN) 50 MG capsule Take 1 capsule (50 mg total) by mouth 2 (two) times daily with a meal. 01/22/14   Angelica Chessman, MD  methocarbamol  (ROBAXIN) 500 MG tablet Take 1 tablet (500 mg total) by mouth 2 (two) times daily. 08/22/13   Reyne Dumas, MD  traMADol (ULTRAM) 50 MG tablet Take 1 tablet (50 mg total) by mouth every 6 (six) hours as needed. 08/22/13   Reyne Dumas, MD  triamterene-hydrochlorothiazide (MAXZIDE-25) 37.5-25 MG per tablet Take 1 tablet by mouth daily. 01/22/14   Angelica Chessman, MD  Vitamin D, Ergocalciferol, (DRISDOL) 50000 UNITS CAPS capsule Take 1 capsule (50,000 Units total) by mouth every 7 (seven) days. 10/22/13   Angelica Chessman, MD     Objective:   Filed Vitals:   01/22/14 1121  BP: 131/86  Pulse: 82  Temp: 98.5 F (36.9 C)  TempSrc: Oral  Resp: 16  Height: 5' (1.524 m)  Weight: 125 lb (56.7 kg)  SpO2: 97%    Exam General appearance : Awake, alert, not in any distress. Speech Clear. Not toxic looking HEENT: Atraumatic and Normocephalic, pupils equally reactive to light and accomodation Neck: supple, no JVD. No cervical lymphadenopathy.  Chest:Good air entry bilaterally, no added sounds  CVS: S1 S2 regular, no murmurs.  Abdomen: Bowel sounds present, Non tender and not distended with no gaurding, rigidity or rebound. Extremities: B/L Lower Ext shows no edema, both legs are warm to touch Neurology: Awake alert, and oriented X 3, CN II-XII intact, Non focal Skin:No Rash Wounds:N/A  Data Review Lab Results  Component Value Date   HGBA1C 6.6 01/22/2014   HGBA1C 5.8 08/22/2013   HGBA1C 5.9 08/02/2013     Assessment & Plan   1. New onset diabetes mellitus  - Glucose (CBG) - HgB A1c is now 6.6%, patient is known diabetic and was informed to diagnosis Metformin 500 mg by mouth twice a day Diabetic diet Ambulatory referral to diabetic education classes  2. 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  Dispense: 30 tablet; Refill: 6 - indomethacin (INDOCIN) 50 MG capsule; Take 1 capsule (50 mg total) by mouth  2 (two) times daily with a meal.  Dispense: 30 capsule; Refill: 0  3. Essential hypertension  - triamterene-hydrochlorothiazide (MAXZIDE-25) 37.5-25 MG per tablet; Take 1 tablet by mouth daily.  Dispense: 90 tablet; Refill: 3  - Basic Metabolic Panel  4. Unspecified hypothyroidism  - levothyroxine (SYNTHROID, LEVOTHROID) 75 MCG tablet; Take 1 tablet (75 mcg total) by mouth daily.  Dispense: 30 tablet; Refill: 6 - TSH - T4, Free  Patient was counseled extensively on nutrition and exercise  Return in about 6 months (around 07/24/2014), or if symptoms worsen or fail to improve, for Follow up Pain and comorbidities, Hypothyroidism.  The patient was given clear instructions to go to ER or return to medical center if symptoms don't improve, worsen or new problems develop. The patient verbalized understanding. The patient was told to call to get lab results if they haven't heard anything in the next week.  This note has been created with Surveyor, quantity. Any transcriptional errors are unintentional.    Angelica Chessman, MD, [place], [place], [place] and [place], [place]   01/22/2014, 12:00 PM

## 2014-01-22 NOTE — Progress Notes (Signed)
Pt is here following up on her diabetes, HTN, Gout and hypothyroidism.

## 2014-01-23 LAB — BASIC METABOLIC PANEL
BUN: 10 mg/dL (ref 6–23)
CHLORIDE: 99 meq/L (ref 96–112)
CO2: 32 mEq/L (ref 19–32)
Calcium: 9.1 mg/dL (ref 8.4–10.5)
Creat: 0.73 mg/dL (ref 0.50–1.10)
GLUCOSE: 169 mg/dL — AB (ref 70–99)
POTASSIUM: 3.5 meq/L (ref 3.5–5.3)
Sodium: 141 mEq/L (ref 135–145)

## 2014-01-23 LAB — TSH: TSH: 44.996 u[IU]/mL — AB (ref 0.350–4.500)

## 2014-01-23 LAB — T4, FREE: Free T4: 0.71 ng/dL — ABNORMAL LOW (ref 0.80–1.80)

## 2014-02-12 ENCOUNTER — Ambulatory Visit: Payer: Self-pay | Attending: Internal Medicine

## 2014-05-16 ENCOUNTER — Ambulatory Visit: Payer: Self-pay | Attending: Internal Medicine

## 2014-05-24 ENCOUNTER — Other Ambulatory Visit: Payer: Self-pay | Admitting: Internal Medicine

## 2014-05-24 DIAGNOSIS — E038 Other specified hypothyroidism: Secondary | ICD-10-CM

## 2014-05-24 MED ORDER — LEVOTHYROXINE SODIUM 75 MCG PO TABS: 75.0000 ug | ORAL_TABLET | Freq: Every day | ORAL | Status: DC

## 2014-06-25 ENCOUNTER — Ambulatory Visit: Payer: Self-pay | Attending: Internal Medicine | Admitting: Internal Medicine

## 2014-06-25 VITALS — BP 125/87 | HR 105 | Temp 100.5°F | Resp 18

## 2014-06-25 DIAGNOSIS — J029 Acute pharyngitis, unspecified: Secondary | ICD-10-CM | POA: Insufficient documentation

## 2014-06-25 DIAGNOSIS — R51 Headache: Secondary | ICD-10-CM | POA: Insufficient documentation

## 2014-06-25 DIAGNOSIS — H9202 Otalgia, left ear: Secondary | ICD-10-CM | POA: Insufficient documentation

## 2014-06-25 DIAGNOSIS — R6883 Chills (without fever): Secondary | ICD-10-CM | POA: Insufficient documentation

## 2014-06-25 LAB — POCT RAPID STREP A (OFFICE): Rapid Strep A Screen: POSITIVE — AB

## 2014-06-25 MED ORDER — PENICILLIN V POTASSIUM 500 MG PO TABS: 500.0000 mg | ORAL_TABLET | Freq: Two times a day (BID) | ORAL | Status: AC

## 2014-06-25 NOTE — Progress Notes (Signed)
Patient walked in with son c/o 2 day hx of severe sore throat, chills,  feeling warm, headache, left ear pain,and yellow nasal drainage Denies cough Reports using acetaminophen and alka seltzer cold with some relief  BP 125/87 P 105 R 18 T 100.5 oral SPO2 98%  Throat very red Left ear canal red without drainage; tympanic membrane intact without bulging or drainage Lungs clear to auscultation  Rapid strep A positive  Per provider: Penicillin V 500 mg po bid for 10 days Advise salt water rinses  Rx e-scribed to [facility]  Patient and son instructed to schedule 6 month f/u with PCP for DM and other health issues (due 07/24/14)

## 2014-07-25 ENCOUNTER — Ambulatory Visit: Payer: Self-pay | Attending: Internal Medicine | Admitting: Internal Medicine

## 2014-07-25 ENCOUNTER — Other Ambulatory Visit: Payer: Self-pay

## 2014-07-25 ENCOUNTER — Encounter: Payer: Self-pay | Admitting: Internal Medicine

## 2014-07-25 ENCOUNTER — Ambulatory Visit (HOSPITAL_COMMUNITY)
Admission: RE | Admit: 2014-07-25 | Discharge: 2014-07-25 | Disposition: A | Discharge status: Home / Self Care | Payer: Self-pay | Source: Ambulatory Visit | Attending: Cardiology | Admitting: Cardiology

## 2014-07-25 VITALS — BP 137/89 | HR 60 | Temp 97.7°F | Resp 16 | Ht 62.0 in | Wt 115.0 lb

## 2014-07-25 DIAGNOSIS — E039 Hypothyroidism, unspecified: Secondary | ICD-10-CM | POA: Insufficient documentation

## 2014-07-25 DIAGNOSIS — E119 Type 2 diabetes mellitus without complications: Secondary | ICD-10-CM | POA: Insufficient documentation

## 2014-07-25 DIAGNOSIS — M109 Gout, unspecified: Secondary | ICD-10-CM

## 2014-07-25 DIAGNOSIS — R002 Palpitations: Secondary | ICD-10-CM

## 2014-07-25 DIAGNOSIS — R7303 Prediabetes: Secondary | ICD-10-CM | POA: Insufficient documentation

## 2014-07-25 DIAGNOSIS — I10 Essential (primary) hypertension: Secondary | ICD-10-CM | POA: Insufficient documentation

## 2014-07-25 DIAGNOSIS — E1165 Type 2 diabetes mellitus with hyperglycemia: Secondary | ICD-10-CM

## 2014-07-25 DIAGNOSIS — E038 Other specified hypothyroidism: Secondary | ICD-10-CM

## 2014-07-25 DIAGNOSIS — R7309 Other abnormal glucose: Secondary | ICD-10-CM

## 2014-07-25 DIAGNOSIS — M10072 Idiopathic gout, left ankle and foot: Secondary | ICD-10-CM | POA: Insufficient documentation

## 2014-07-25 LAB — COMPLETE METABOLIC PANEL WITH GFR
ALT: 15 U/L (ref 0–35)
AST: 19 U/L (ref 0–37)
Albumin: 4.7 g/dL (ref 3.5–5.2)
Alkaline Phosphatase: 52 U/L (ref 39–117)
BUN: 16 mg/dL (ref 6–23)
CHLORIDE: 101 meq/L (ref 96–112)
CO2: 32 mEq/L (ref 19–32)
CREATININE: 0.78 mg/dL (ref 0.50–1.10)
Calcium: 9.9 mg/dL (ref 8.4–10.5)
GFR, Est African American: >89 mL/min
GFR, Est Non African American: 84 mL/min
Glucose, Bld: 98 mg/dL (ref 70–99)
Potassium: 4.5 mEq/L (ref 3.5–5.3)
Sodium: 142 mEq/L (ref 135–145)
TOTAL PROTEIN: 7.6 g/dL (ref 6.0–8.3)
Total Bilirubin: 0.5 mg/dL (ref 0.2–1.2)

## 2014-07-25 LAB — POCT GLYCOSYLATED HEMOGLOBIN (HGB A1C): HEMOGLOBIN A1C: 5.9

## 2014-07-25 MED ORDER — METFORMIN HCL 500 MG PO TABS: 500.0000 mg | ORAL_TABLET | Freq: Two times a day (BID) | ORAL | Status: DC

## 2014-07-25 MED ORDER — TRIAMTERENE-HCTZ 37.5-25 MG PO TABS: 1.0000 | ORAL_TABLET | Freq: Every day | ORAL | Status: DC

## 2014-07-25 MED ORDER — INDOMETHACIN 50 MG PO CAPS: 50.0000 mg | ORAL_CAPSULE | Freq: Two times a day (BID) | ORAL | Status: DC

## 2014-07-25 MED ORDER — COLCHICINE 0.6 MG PO TABS: 0.6000 mg | ORAL_TABLET | Freq: Every day | ORAL | Status: DC

## 2014-07-25 MED ORDER — LEVOTHYROXINE SODIUM 150 MCG PO TABS
150.0000 ug | ORAL_TABLET | Freq: Every day | ORAL | Status: DC
Start: 2014-07-25 — End: 2015-02-06

## 2014-07-25 MED ORDER — ALLOPURINOL 300 MG PO TABS: 300.0000 mg | ORAL_TABLET | Freq: Every day | ORAL | Status: DC

## 2014-07-25 NOTE — Progress Notes (Signed)
Patient ID: Jamie [name], female   DOB: July 20, 1956, 58 y.o.   MRN: [id number]   Jamie [name], is a 58 y.o. female  [id number]  [id number]  DOB - June 03, 1956  Chief Complaint  Patient presents with  . Follow-up        Subjective:   Jamie [name] is a 58 y.o. female here today for a follow up visit. Patient has history of type 2 diabetes mellitus, essential hypertension, hypothyroidism, gouty arthritis and chronic back pain here today for routine follow-up. She is complaining of flareup of her gout need the medication especially to control pain. Patient said that occasionally she has had shortness of breath and irregular heartbeat. She does not have any other symptoms today. No lightheadedness. Never diagnosed with any arrythmia before. Patient has No headache, No chest pain, No abdominal pain - No Nausea, No new weakness tingling or numbness, No Cough - SOB.  Problem  Essential Hypertension  Palpitations  Other Specified Hypothyroidism  Prediabetes    ALLERGIES: No Known Allergies  PAST MEDICAL HISTORY: Past Medical History  Diagnosis Date  . Thyroid disease   . Hypertension   . Uterine enlargement   . Diabetes mellitus   . Osteopenia   . Arthritis   . Cancer     thyroid  . Uterine enlargement     MEDICATIONS AT HOME: Prior to Admission medications   Medication Sig Start Date End Date Taking? Authorizing Provider  levothyroxine (SYNTHROID, LEVOTHROID) 150 MCG tablet Take 1 tablet (150 mcg total) by mouth daily. 07/25/14  Yes Tresa Garter, MD  triamterene-hydrochlorothiazide (MAXZIDE-25) 37.5-25 MG per tablet Take 1 tablet by mouth daily. 07/25/14  Yes Tresa Garter, MD  acetaminophen (TYLENOL) 500 MG tablet Take 500 mg by mouth every 6 (six) hours as needed.    Historical Provider, MD  allopurinol (ZYLOPRIM) 300 MG tablet Take 1 tablet (300 mg total) by mouth daily. 07/25/14   Tresa Garter, MD  colchicine 0.6 MG tablet Take 1 tablet (0.6 mg total) by mouth daily.  07/25/14   Tresa Garter, MD  indomethacin (INDOCIN) 50 MG capsule Take 1 capsule (50 mg total) by mouth 2 (two) times daily with a meal. 07/25/14   Tresa Garter, MD  metFORMIN (GLUCOPHAGE) 500 MG tablet Take 1 tablet (500 mg total) by mouth 2 (two) times daily with a meal. 07/25/14   Tresa Garter, MD  methocarbamol (ROBAXIN) 500 MG tablet Take 1 tablet (500 mg total) by mouth 2 (two) times daily. Patient not taking: Reported on 07/25/2014 08/22/13   Reyne Dumas, MD  traMADol (ULTRAM) 50 MG tablet Take 1 tablet (50 mg total) by mouth every 6 (six) hours as needed. Patient not taking: Reported on 06/25/2014 08/22/13   Reyne Dumas, MD  Vitamin D, Ergocalciferol, (DRISDOL) 50000 UNITS CAPS capsule Take 1 capsule (50,000 Units total) by mouth every 7 (seven) days. Patient not taking: Reported on 06/25/2014 10/22/13   Tresa Garter, MD     Objective:   Filed Vitals:   07/25/14 1055  BP: 137/89  Pulse: 60  Temp: 97.7 F (36.5 C)  TempSrc: Oral  Resp: 16  Height: 5\' 2"  (1.575 m)  Weight: 115 lb (52.164 kg)  SpO2: 99%    Exam General appearance : Awake, alert, not in any distress. Speech Clear. Not toxic looking HEENT: Atraumatic and Normocephalic, pupils equally reactive to light and accomodation Neck: supple, no JVD. No cervical lymphadenopathy.  Chest:Good air entry bilaterally, no added sounds  CVS:  S1 S2 regular, no murmurs.  Abdomen: Bowel sounds present, Non tender and not distended with no gaurding, rigidity or rebound. Extremities: B/L Lower Ext shows no edema, both legs are warm to touch Neurology: Awake alert, and oriented X 3, CN II-XII intact, Non focal Skin:No Rash Wounds:N/A  Data Review Lab Results  Component Value Date   HGBA1C 6.6 01/22/2014   HGBA1C 5.8 08/22/2013   HGBA1C 5.9 08/02/2013     Assessment & Plan   EKG performed in the clinic today and interpreted by me, showed normal sinus rhythm with a ventricular rate of 64 b/m  1.  Gouty arthritis of toe of left foot  - indomethacin (INDOCIN) 50 MG capsule; Take 1 capsule (50 mg total) by mouth 2 (two) times daily with a meal.  Dispense: 30 capsule; Refill: 0 - allopurinol (ZYLOPRIM) 300 MG tablet; Take 1 tablet (300 mg total) by mouth daily.  Dispense: 30 tablet; Refill: 3 - colchicine 0.6 MG tablet; Take 1 tablet (0.6 mg total) by mouth daily.  Dispense: 30 tablet; Refill: 3  2. Type 2 diabetes mellitus   - POCT glycosylated hemoglobin (Hb A1C) is 5.9% today  Aim for 2-3 Carb Choices per meal (30-45 grams) +/- 1 either way  Aim for 0-15 Carbs per snack if hungry  Include protein in moderation with your meals and snacks  Consider reading food labels for Total Carbohydrate and Fat Grams of foods  Consider checking BG at alternate times per day  Continue taking medication as directed Fruit Punch - find one with no sugar  Measure and decrease portions of carbohydrate foods  Make your plate and don't go back for seconds   3. Essential hypertension  - COMPLETE METABOLIC PANEL WITH GFR  - triamterene-hydrochlorothiazide (MAXZIDE-25) 37.5-25 MG per tablet; Take 1 tablet by mouth daily.  Dispense: 90 tablet; Refill: 3  5. Type 2 diabetes mellitus without complication  - metFORMIN (GLUCOPHAGE) 500 MG tablet; Take 1 tablet (500 mg total) by mouth 2 (two) times daily with a meal.  Dispense: 180 tablet; Refill: 3  6. Other specified hypothyroidism  - TSH - T4, Free - levothyroxine (SYNTHROID, LEVOTHROID) 150 MCG tablet; Take 1 tablet (150 mcg total) by mouth daily.  Dispense: 90 tablet; Refill: 3  Patient was counseled extensively about nutrition and exercise.  Return in about 3 months (around 10/24/2014) for Hemoglobin A1C and Follow up, DM, Hypothyroidism.  The patient was given clear instructions to go to ER or return to medical center if symptoms don't improve, worsen or new problems develop. The patient verbalized understanding. The patient was told to call to  get lab results if they haven't heard anything in the next week.   This note has been created with Surveyor, quantity. Any transcriptional errors are unintentional.    Angelica Chessman, MD, [place], [place], [place] and [place], [place]   07/25/2014, 11:38 AM

## 2014-07-25 NOTE — Progress Notes (Signed)
Pt is here following up on her hypothyroidism. Pt is having occasional gout flares. Pt states that lately she get SOB and irregular heart beats.

## 2014-07-26 LAB — TSH: TSH: 3.641 u[IU]/mL (ref 0.350–4.500)

## 2014-07-26 LAB — T4, FREE: Free T4: 1.6 ng/dL (ref 0.80–1.80)

## 2014-08-01 ENCOUNTER — Telehealth: Payer: Self-pay | Admitting: Internal Medicine

## 2014-08-01 NOTE — Telephone Encounter (Signed)
Pt's daughter calling to schedule appt for pt who has a cough, with traces of blood, pt was scheduled the next available appt on 08/08/13 but was advised to go to [facility] or ED if necessary in the interim. Please f/u with pt's daughter to advise on what to do.

## 2014-08-08 ENCOUNTER — Ambulatory Visit: Payer: Self-pay | Attending: Internal Medicine | Admitting: Internal Medicine

## 2014-08-08 ENCOUNTER — Encounter: Payer: Self-pay | Admitting: Internal Medicine

## 2014-08-08 VITALS — BP 116/76 | HR 67 | Temp 98.2°F | Resp 16 | Ht 59.0 in | Wt 115.0 lb

## 2014-08-08 DIAGNOSIS — J069 Acute upper respiratory infection, unspecified: Secondary | ICD-10-CM

## 2014-08-08 DIAGNOSIS — E119 Type 2 diabetes mellitus without complications: Secondary | ICD-10-CM

## 2014-08-08 LAB — GLUCOSE, POCT (MANUAL RESULT ENTRY): POC GLUCOSE: 85 mg/dL (ref 70–99)

## 2014-08-08 MED ORDER — AMOXICILLIN 500 MG PO CAPS: 500.0000 mg | ORAL_CAPSULE | Freq: Three times a day (TID) | ORAL | Status: DC

## 2014-08-08 MED ORDER — BENZOCAINE-MENTHOL 6-10 MG MT LOZG: 1.0000 | LOZENGE | OROMUCOSAL | Status: DC | PRN

## 2014-08-08 NOTE — Progress Notes (Signed)
Patient ID: Jamie [name], female   DOB: July 11, 1956, 59 y.o.   MRN: [id number]   Jamie [name], is a 58 y.o. female  [id number]  [id number]  DOB - 1955/12/19  Chief Complaint  Patient presents with  . Follow-up  . Sore Throat    positive strep throat        Subjective:   Jamie [name] is a 59 y.o. female here today for a follow up visit. Patient has history of type 2 diabetes mellitus, essential hypertension, hypothyroidism, gouty arthritis and chronic back pain, came to the clinic today with complaint of sore throat. She has been seen previously for this sore throat and was on symptomatic management, given penicillin antibiotics, continues to have pain with swallowing but no fever, no nausea or vomiting. Patient's blood sugar and blood pressure well controlled on current medications, patient is compliant with medications, she reports no side effects. Patient has No headache, No chest pain, No abdominal pain - No Nausea, No new weakness tingling or numbness, No Cough - SOB.  No problems updated.  ALLERGIES: No Known Allergies  PAST MEDICAL HISTORY: Past Medical History  Diagnosis Date  . Thyroid disease   . Hypertension   . Uterine enlargement   . Diabetes mellitus   . Osteopenia   . Arthritis   . Cancer     thyroid  . Uterine enlargement     MEDICATIONS AT HOME: Prior to Admission medications   Medication Sig Start Date End Date Taking? Authorizing Provider  levothyroxine (SYNTHROID, LEVOTHROID) 150 MCG tablet Take 1 tablet (150 mcg total) by mouth daily. 07/25/14  Yes Tresa Garter, MD  triamterene-hydrochlorothiazide (MAXZIDE-25) 37.5-25 MG per tablet Take 1 tablet by mouth daily. 07/25/14  Yes Tresa Garter, MD  acetaminophen (TYLENOL) 500 MG tablet Take 500 mg by mouth every 6 (six) hours as needed.    Historical Provider, MD  allopurinol (ZYLOPRIM) 300 MG tablet Take 1 tablet (300 mg total) by mouth daily. Patient not taking: Reported on 08/08/2014 07/25/14   Tresa Garter, MD  amoxicillin (AMOXIL) 500 MG capsule Take 1 capsule (500 mg total) by mouth 3 (three) times daily. 08/08/14   Tresa Garter, MD  benzocaine-menthol (CHLORAEPTIC) 6-10 MG lozenge Take 1 lozenge by mouth as needed for sore throat. 08/08/14   Tresa Garter, MD  colchicine 0.6 MG tablet Take 1 tablet (0.6 mg total) by mouth daily. Patient not taking: Reported on 08/08/2014 07/25/14   Tresa Garter, MD  indomethacin (INDOCIN) 50 MG capsule Take 1 capsule (50 mg total) by mouth 2 (two) times daily with a meal. Patient not taking: Reported on 08/08/2014 07/25/14   Tresa Garter, MD  metFORMIN (GLUCOPHAGE) 500 MG tablet Take 1 tablet (500 mg total) by mouth 2 (two) times daily with a meal. Patient not taking: Reported on 08/08/2014 07/25/14   Tresa Garter, MD  methocarbamol (ROBAXIN) 500 MG tablet Take 1 tablet (500 mg total) by mouth 2 (two) times daily. Patient not taking: Reported on 07/25/2014 08/22/13   Reyne Dumas, MD  traMADol (ULTRAM) 50 MG tablet Take 1 tablet (50 mg total) by mouth every 6 (six) hours as needed. Patient not taking: Reported on 06/25/2014 08/22/13   Reyne Dumas, MD  Vitamin D, Ergocalciferol, (DRISDOL) 50000 UNITS CAPS capsule Take 1 capsule (50,000 Units total) by mouth every 7 (seven) days. Patient not taking: Reported on 06/25/2014 10/22/13   Tresa Garter, MD     Objective:   Danley Danker Vitals:  08/08/14 1127  BP: 116/76  Pulse: 67  Temp: 98.2 F (36.8 C)  TempSrc: Oral  Resp: 16  Height: 4\' 11"  (1.499 m)  Weight: 115 lb (52.164 kg)  SpO2: 98%    Exam General appearance : Awake, alert, not in any distress. Speech Clear. Not toxic looking HEENT: Atraumatic and Normocephalic, pupils equally reactive to light and accomodation,  Neck: supple, no JVD. No cervical lymphadenopathy.  Chest:Good air entry bilaterally, no added sounds  CVS: S1 S2 regular, no murmurs.  Abdomen: Bowel sounds present, Non tender and not distended  with no gaurding, rigidity or rebound. Extremities: B/L Lower Ext shows no edema, both legs are warm to touch Neurology: Awake alert, and oriented X 3, CN II-XII intact, Non focal Skin:No Rash Wounds:N/A  Data Review Lab Results  Component Value Date   HGBA1C 5.90 07/25/2014   HGBA1C 6.6 01/22/2014   HGBA1C 5.8 08/22/2013     Assessment & Plan   1. Type 2 diabetes mellitus without complication  - Glucose (CBG) - Last hemoglobin A1c was 5.9% - Blood sugars controlled, continue metformin  2. Upper respiratory infection  - amoxicillin (AMOXIL) 500 MG capsule; Take 1 capsule (500 mg total) by mouth 3 (three) times daily.  Dispense: 30 capsule; Refill: 0 - benzocaine-menthol (CHLORAEPTIC) 6-10 MG lozenge; Take 1 lozenge by mouth as needed for sore throat.  Dispense: 100 tablet; Refill: 0  Patient was counseled extensively about nutrition and exercise  Interpreter was used to communicate directly with patient for the entire encounter including providing detailed patient instructions.   Return in about 3 months (around 11/07/2014) for Hemoglobin A1C and Follow up, DM, Follow up Pain and comorbidities.  The patient was given clear instructions to go to ER or return to medical center if symptoms don't improve, worsen or new problems develop. The patient verbalized understanding. The patient was told to call to get lab results if they haven't heard anything in the next week.   This note has been created with Surveyor, quantity. Any transcriptional errors are unintentional.    Angelica Chessman, MD, [place], [place], [place] and [place], [place]   08/08/2014, 11:59 AM

## 2014-08-08 NOTE — Patient Instructions (Signed)
Diabetes and Exercise Exercising regularly is important. It is not just about losing weight. It has many health benefits, such as:  Improving your overall fitness, flexibility, and endurance.  Increasing your bone density.  Helping with weight control.  Decreasing your body fat.  Increasing your muscle strength.  Reducing stress and tension.  Improving your overall health. People with diabetes who exercise gain additional benefits because exercise:  Reduces appetite.  Improves the body's use of blood sugar (glucose).  Helps lower or control blood glucose.  Decreases blood pressure.  Helps control blood lipids (such as cholesterol and triglycerides).  Improves the body's use of the hormone insulin by:  Increasing the body's insulin sensitivity.  Reducing the body's insulin needs.  Decreases the risk for heart disease because exercising:  Lowers cholesterol and triglycerides levels.  Increases the levels of good cholesterol (such as high-density lipoproteins [HDL]) in the body.  Lowers blood glucose levels. YOUR ACTIVITY PLAN  Choose an activity that you enjoy and set realistic goals. Your health care provider or diabetes educator can help you make an activity plan that works for you. Exercise regularly as directed by your health care provider. This includes:  Performing resistance training twice a week such as push-ups, sit-ups, lifting weights, or using resistance bands.  Performing 150 minutes of cardio exercises each week such as walking, running, or playing sports.  Staying active and spending no more than 90 minutes at one time being inactive. Even short bursts of exercise are good for you. Three 10-minute sessions spread throughout the day are just as beneficial as a single 30-minute session. Some exercise ideas include:  Taking the dog for a walk.  Taking the stairs instead of the elevator.  Dancing to your favorite song.  Doing an exercise  video.  Doing your favorite exercise with a friend. RECOMMENDATIONS FOR EXERCISING WITH TYPE 1 OR TYPE 2 DIABETES   Check your blood glucose before exercising. If blood glucose levels are greater than 240 mg/dL, check for urine ketones. Do not exercise if ketones are present.  Avoid injecting insulin into areas of the body that are going to be exercised. For example, avoid injecting insulin into:  The arms when playing tennis.  The legs when jogging.  Keep a record of:  Food intake before and after you exercise.  Expected peak times of insulin action.  Blood glucose levels before and after you exercise.  The type and amount of exercise you have done.  Review your records with your health care provider. Your health care provider will help you to develop guidelines for adjusting food intake and insulin amounts before and after exercising.  If you take insulin or oral hypoglycemic agents, watch for signs and symptoms of hypoglycemia. They include:  Dizziness.  Shaking.  Sweating.  Chills.  Confusion.  Drink plenty of water while you exercise to prevent dehydration or heat stroke. Body water is lost during exercise and must be replaced.  Talk to your health care provider before starting an exercise program to make sure it is safe for you. Remember, almost any type of activity is better than none. Document Released: 10/02/2003 Document Revised: 11/26/2013 Document Reviewed: 12/19/2012 ExitCare Patient Information 2015 [employer]. This information is not intended to replace advice given to you by your health care provider. Make sure you discuss any questions you have with your health care provider. Basic Carbohydrate Counting for Diabetes Mellitus Carbohydrate counting is a method for keeping track of the amount of carbohydrates you eat.   Eating carbohydrates naturally increases the level of sugar (glucose) in your blood, so it is important for you to know the amount that is  okay for you to have in every meal. Carbohydrate counting helps keep the level of glucose in your blood within normal limits. The amount of carbohydrates allowed is different for every person. A dietitian can help you calculate the amount that is right for you. Once you know the amount of carbohydrates you can have, you can count the carbohydrates in the foods you want to eat. Carbohydrates are found in the following foods:  Grains, such as breads and cereals.  Dried beans and soy products.  Starchy vegetables, such as potatoes, peas, and corn.  Fruit and fruit juices.  Milk and yogurt.  Sweets and snack foods, such as cake, cookies, candy, chips, soft drinks, and fruit drinks. CARBOHYDRATE COUNTING There are two ways to count the carbohydrates in your food. You can use either of the methods or a combination of both. Reading the "Nutrition Facts" on Packaged Food The "Nutrition Facts" is an area that is included on the labels of almost all packaged food and beverages in the [place]. It includes the serving size of that food or beverage and information about the nutrients in each serving of the food, including the grams (g) of carbohydrate per serving.  Decide the number of servings of this food or beverage that you will be able to eat or drink. Multiply that number of servings by the number of grams of carbohydrate that is listed on the label for that serving. The total will be the amount of carbohydrates you will be having when you eat or drink this food or beverage. Learning Standard Serving Sizes of Food When you eat food that is not packaged or does not include "Nutrition Facts" on the label, you need to measure the servings in order to count the amount of carbohydrates.A serving of most carbohydrate-rich foods contains about 15 g of carbohydrates. The following list includes serving sizes of carbohydrate-rich foods that provide 15 g ofcarbohydrate per serving:   1 slice of bread  (1 oz) or 1 six-inch tortilla.    of a hamburger bun or English muffin.  4-6 crackers.   cup unsweetened dry cereal.    cup hot cereal.   cup rice or pasta.    cup mashed potatoes or  of a large baked potato.  1 cup fresh fruit or one small piece of fruit.    cup canned or frozen fruit or fruit juice.  1 cup milk.   cup plain fat-free yogurt or yogurt sweetened with artificial sweeteners.   cup cooked dried beans or starchy vegetable, such as peas, corn, or potatoes.  Decide the number of standard-size servings that you will eat. Multiply that number of servings by 15 (the grams of carbohydrates in that serving). For example, if you eat 2 cups of strawberries, you will have eaten 2 servings and 30 g of carbohydrates (2 servings x 15 g = 30 g). For foods such as soups and casseroles, in which more than one food is mixed in, you will need to count the carbohydrates in each food that is included. EXAMPLE OF CARBOHYDRATE COUNTING Sample Dinner  3 oz chicken breast.   cup of brown rice.   cup of corn.  1 cup milk.   1 cup strawberries with sugar-free whipped topping.  Carbohydrate Calculation Step 1: Identify the foods that contain carbohydrates:   Rice.   Corn.     Milk.   Strawberries. Step 2:Calculate the number of servings eaten of each:   2 servings of rice.   1 serving of corn.   1 serving of milk.   1 serving of strawberries. Step 3: Multiply each of those number of servings by 15 g:   2 servings of rice x 15 g = 30 g.   1 serving of corn x 15 g = 15 g.   1 serving of milk x 15 g = 15 g.   1 serving of strawberries x 15 g = 15 g. Step 4: Add together all of the amounts to find the total grams of carbohydrates eaten: 30 g + 15 g + 15 g + 15 g = 75 g. Document Released: 07/12/2005 Document Revised: 11/26/2013 Document Reviewed: 06/08/2013 ExitCare Patient Information 2015 [employer]. This information is not intended to  replace advice given to you by your health care provider. Make sure you discuss any questions you have with your health care provider.  

## 2014-08-08 NOTE — Progress Notes (Signed)
Pt comes in for f/u positive strep throat, on PCN antibiotics  States she took last dose yesterday C/o pain with swallowing On exam no redness or inflammation seen Afebrile Denies n/v Daughter present for interpretor Not taking Metformin, last A1c 5.9

## 2014-08-15 ENCOUNTER — Telehealth: Payer: Self-pay | Admitting: Emergency Medicine

## 2014-08-15 NOTE — Telephone Encounter (Signed)
Pt given normal blood work results per [employer]

## 2014-08-15 NOTE — Telephone Encounter (Signed)
-----   Message from Tresa Garter, MD sent at 07/31/2014  8:53 AM EST ----- Please inform patient that her laboratory test results are all within normal limits.

## 2014-08-22 ENCOUNTER — Ambulatory Visit: Payer: Self-pay | Attending: Internal Medicine

## 2014-09-13 ENCOUNTER — Ambulatory Visit: Payer: Self-pay

## 2014-10-21 ENCOUNTER — Ambulatory Visit: Payer: 59 | Attending: Internal Medicine

## 2014-12-04 ENCOUNTER — Emergency Department (INDEPENDENT_AMBULATORY_CARE_PROVIDER_SITE_OTHER)
Admission: EM | Admit: 2014-12-04 | Discharge: 2014-12-04 | Disposition: A | Discharge status: Home / Self Care | Payer: 59 | Source: Home / Self Care | Attending: Family Medicine | Admitting: Family Medicine

## 2014-12-04 ENCOUNTER — Encounter (HOSPITAL_COMMUNITY): Payer: Self-pay

## 2014-12-04 DIAGNOSIS — R7303 Prediabetes: Secondary | ICD-10-CM

## 2014-12-04 DIAGNOSIS — R109 Unspecified abdominal pain: Secondary | ICD-10-CM | POA: Diagnosis not present

## 2014-12-04 DIAGNOSIS — K5909 Other constipation: Secondary | ICD-10-CM

## 2014-12-04 DIAGNOSIS — R7309 Other abnormal glucose: Secondary | ICD-10-CM | POA: Diagnosis not present

## 2014-12-04 MED ORDER — POLYETHYLENE GLYCOL 3350 17 G PO PACK: 17.0000 g | PACK | Freq: Every day | ORAL | Status: DC | PRN

## 2014-12-04 MED ORDER — SENNOSIDES 8.6 MG PO TABS: 1.0000 | ORAL_TABLET | Freq: Every day | ORAL | Status: DC

## 2014-12-04 NOTE — Discharge Instructions (Signed)
Your symptoms are primarily from constipation and irritation of the colon. Please start using the senna and MiraLAX as prescribed. He may have to buy this over-the-counter. You're okay to stop using her metformin as her blood sugar levels are very normal. Please follow-up with your primary care physician as needed for further management if needed. He can continue using ibuprofen 400-600 mg every 6 hours for pain.

## 2014-12-04 NOTE — ED Notes (Signed)
History via son, who is acting as Optometrist for his mother. Reports she is having onset 5-7 56f general abdominal pain, now mostly periumbilical area, w some constipation. Pain sometimes in low , transverse back area. Denies nausea or vomiting, and her constipation was relieved w drinking milk. History of DM, but has not been taking her Rx, because of the way it makes her feel (advised via son to see her PCP to have medication adjusted to prevent problems from uncontrolled DM). Using tylenol for pain

## 2014-12-04 NOTE — ED Provider Notes (Signed)
CSN: [id number]     Arrival date & time 12/04/14  1118 History   First MD Initiated Contact with Patient 12/04/14 1255     Chief Complaint  Patient presents with  . Abdominal Pain   (Consider location/radiation/quality/duration/timing/severity/associated sxs/prior Treatment) HPI  Pt encounter aided by family member acting as interpretor   Abd pain: ongoing for 7 days. Comes and goes. No specific aggravating factors such as food or movement. Episodes last less than 1 minute. Improved after tylenol. "feels like I need to have a BM." Prior to onse tof pain pt would have BMs Qday. Since onset pain pt initially w/o BM for 3 days. Now starting to have daily again. Stomach pain initially "all over", but now around the naval w/ radiation to the back.  Overall improving since onset  Home CBGs currently in the low 100 range. Patient stopped taking her metformin several weeks ago due to upset GI.  Denies fevers, nasuea, vomiting, diarrhea, LOC, rash, CP, SOB, palpitations, dysuria, frequency.   Past Medical History  Diagnosis Date  . Thyroid disease   . Hypertension   . Uterine enlargement   . Diabetes mellitus   . Osteopenia   . Arthritis   . Cancer     thyroid  . Uterine enlargement    Past Surgical History  Procedure Laterality Date  . Thyroid lobectomy    . Total thyroidectomy  2012  . Colonoscopy  06/15/2011    Procedure: COLONOSCOPY;  Surgeon: Jeryl Columbia, MD;  Location: [facility] ENDOSCOPY;  Service: Endoscopy;  Laterality: N/A;   Family History  Problem Relation Age of Onset  . Cancer Father     lung   History  Substance Use Topics  . Smoking status: Never Smoker   . Smokeless tobacco: Never Used  . Alcohol Use: No   OB History    No data available     Review of Systems Per HPI with all other pertinent systems negative.   Allergies  Review of patient's allergies indicates no known allergies.  Home Medications   Prior to Admission medications   Medication Sig Start  Date End Date Taking? Authorizing Provider  acetaminophen (TYLENOL) 500 MG tablet Take 500 mg by mouth every 6 (six) hours as needed.    Historical Provider, MD  allopurinol (ZYLOPRIM) 300 MG tablet Take 1 tablet (300 mg total) by mouth daily. Patient not taking: Reported on 08/08/2014 07/25/14   Tresa Garter, MD  benzocaine-menthol (CHLORAEPTIC) 6-10 MG lozenge Take 1 lozenge by mouth as needed for sore throat. 08/08/14   Tresa Garter, MD  colchicine 0.6 MG tablet Take 1 tablet (0.6 mg total) by mouth daily. Patient not taking: Reported on 08/08/2014 07/25/14   Tresa Garter, MD  indomethacin (INDOCIN) 50 MG capsule Take 1 capsule (50 mg total) by mouth 2 (two) times daily with a meal. Patient not taking: Reported on 08/08/2014 07/25/14   Tresa Garter, MD  levothyroxine (SYNTHROID, LEVOTHROID) 150 MCG tablet Take 1 tablet (150 mcg total) by mouth daily. 07/25/14   Tresa Garter, MD  metFORMIN (GLUCOPHAGE) 500 MG tablet Take 1 tablet (500 mg total) by mouth 2 (two) times daily with a meal. Patient not taking: Reported on 08/08/2014 07/25/14   Tresa Garter, MD  methocarbamol (ROBAXIN) 500 MG tablet Take 1 tablet (500 mg total) by mouth 2 (two) times daily. Patient not taking: Reported on 07/25/2014 08/22/13   Reyne Dumas, MD  polyethylene glycol (MIRALAX / GLYCOLAX) packet Take 17  g by mouth daily as needed. 12/04/14   Waldemar Dickens, MD  senna (SENEXON) 8.6 MG tablet Take 1 tablet (8.6 mg total) by mouth daily. 12/04/14   Waldemar Dickens, MD  traMADol (ULTRAM) 50 MG tablet Take 1 tablet (50 mg total) by mouth every 6 (six) hours as needed. Patient not taking: Reported on 06/25/2014 08/22/13   Reyne Dumas, MD  triamterene-hydrochlorothiazide (MAXZIDE-25) 37.5-25 MG per tablet Take 1 tablet by mouth daily. 07/25/14   Tresa Garter, MD  Vitamin D, Ergocalciferol, (DRISDOL) 50000 UNITS CAPS capsule Take 1 capsule (50,000 Units total) by mouth every 7 (seven)  days. Patient not taking: Reported on 06/25/2014 10/22/13   Tresa Garter, MD   BP 132/90 mmHg  Pulse 72  Temp(Src) 98.3 F (36.8 C) (Oral)  Resp 16  SpO2 95% Physical Exam Physical Exam  Constitutional: oriented to person, place, and time. appears well-developed and well-nourished. No distress.  HENT:  Head: Normocephalic and atraumatic.  Eyes: EOMI. PERRL.  Neck: Normal range of motion.  Cardiovascular: RRR, no m/r/g, 2+ distal pulses,  Pulmonary/Chest: Effort normal and breath sounds normal. No respiratory distress.  Abdominal: Soft. Bowel sounds are normal. Mild periumbilical ttp. No pain at McBUrny's point and neg murphy's sign. , no distension.  Musculoskeletal: Normal range of motion. Non ttp, no effusion. no CVA tenderness.  Neurological: alert and oriented to person, place, and time.  Skin: Skin is warm. No rash noted. non diaphoretic.  Psychiatric: normal mood and affect. behavior is normal. Judgment and thought content normal.   ED Course  Procedures (including critical care time) Labs Review Labs Reviewed - No data to display  Imaging Review No results found.   MDM   1. Abdominal pain, unspecified abdominal location   2. Other constipation   3. Pre-diabetes    Okay to stay off of diabetes given excellent blood glucose readings and patient technically in the prediabetic range is A1c only 5.9.  Abdominal discomfort likely secondary to constipation. Improving with more frequent bowel movements. Patient to start using Senokot and MiraLAX as needed for additional relief. Patient followed PCP if needed for further management.    Waldemar Dickens, MD 12/04/14 [phone]

## 2014-12-24 ENCOUNTER — Telehealth: Payer: Self-pay | Admitting: Internal Medicine

## 2014-12-24 DIAGNOSIS — I10 Essential (primary) hypertension: Secondary | ICD-10-CM

## 2014-12-24 NOTE — Telephone Encounter (Signed)
Patient came into office requesting medication refill on triamterene-hydrochlorothiazide (MAXZIDE-25) 37.5-25 MG per tablet. Patient uses [facility] pharmacy. Please f/u with patient

## 2014-12-31 MED ORDER — TRIAMTERENE-HCTZ 37.5-25 MG PO TABS: 1.0000 | ORAL_TABLET | Freq: Every day | ORAL | Status: DC

## 2014-12-31 NOTE — Telephone Encounter (Signed)
Medication refill sent to pharmacy with instructions that patient must have appointment for additional refills.

## 2015-02-04 ENCOUNTER — Ambulatory Visit: Payer: 59 | Attending: Internal Medicine

## 2015-02-06 ENCOUNTER — Encounter: Payer: Self-pay | Admitting: Internal Medicine

## 2015-02-06 ENCOUNTER — Ambulatory Visit: Payer: 59 | Attending: Internal Medicine | Admitting: Internal Medicine

## 2015-02-06 VITALS — BP 127/90 | HR 77 | Temp 98.0°F | Resp 16 | Ht 59.25 in | Wt 112.4 lb

## 2015-02-06 DIAGNOSIS — E119 Type 2 diabetes mellitus without complications: Secondary | ICD-10-CM | POA: Diagnosis not present

## 2015-02-06 DIAGNOSIS — I10 Essential (primary) hypertension: Secondary | ICD-10-CM | POA: Insufficient documentation

## 2015-02-06 DIAGNOSIS — E039 Hypothyroidism, unspecified: Secondary | ICD-10-CM | POA: Diagnosis not present

## 2015-02-06 DIAGNOSIS — Z79899 Other long term (current) drug therapy: Secondary | ICD-10-CM | POA: Insufficient documentation

## 2015-02-06 DIAGNOSIS — E038 Other specified hypothyroidism: Secondary | ICD-10-CM | POA: Diagnosis not present

## 2015-02-06 LAB — POCT GLYCOSYLATED HEMOGLOBIN (HGB A1C): HEMOGLOBIN A1C: 5.8

## 2015-02-06 LAB — GLUCOSE, POCT (MANUAL RESULT ENTRY): POC Glucose: 143 mg/dl — AB (ref 70–99)

## 2015-02-06 MED ORDER — LEVOTHYROXINE SODIUM 150 MCG PO TABS: 150.0000 ug | ORAL_TABLET | Freq: Every day | ORAL | Status: DC

## 2015-02-06 MED ORDER — TRIAMTERENE-HCTZ 37.5-25 MG PO TABS: 1.0000 | ORAL_TABLET | Freq: Every day | ORAL | Status: DC

## 2015-02-06 NOTE — Patient Instructions (Signed)
DASH Eating Plan DASH stands for "Dietary Approaches to Stop Hypertension." The DASH eating plan is a healthy eating plan that has been shown to reduce high blood pressure (hypertension). Additional health benefits may include reducing the risk of type 2 diabetes mellitus, heart disease, and stroke. The DASH eating plan may also help with weight loss. WHAT DO I NEED TO KNOW ABOUT THE DASH EATING PLAN? For the DASH eating plan, you will follow these general guidelines:  Choose foods with a percent daily value for sodium of less than 5% (as listed on the food label).  Use salt-free seasonings or herbs instead of table salt or sea salt.  Check with your health care provider or pharmacist before using salt substitutes.  Eat lower-sodium products, often labeled as "lower sodium" or "no salt added."  Eat fresh foods.  Eat more vegetables, fruits, and low-fat dairy products.  Choose whole grains. Look for the word "whole" as the first word in the ingredient list.  Choose fish and skinless chicken or turkey more often than red meat. Limit fish, poultry, and meat to 6 oz (170 g) each day.  Limit sweets, desserts, sugars, and sugary drinks.  Choose heart-healthy fats.  Limit cheese to 1 oz (28 g) per day.  Eat more home-cooked food and less restaurant, buffet, and fast food.  Limit fried foods.  Cook foods using methods other than frying.  Limit canned vegetables. If you do use them, rinse them well to decrease the sodium.  When eating at a restaurant, ask that your food be prepared with less salt, or no salt if possible. WHAT FOODS CAN I EAT? Seek help from a dietitian for individual calorie needs. Grains Whole grain or whole wheat bread. Brown rice. Whole grain or whole wheat pasta. Quinoa, bulgur, and whole grain cereals. Low-sodium cereals. Corn or whole wheat flour tortillas. Whole grain cornbread. Whole grain crackers. Low-sodium crackers. Vegetables Fresh or frozen vegetables  (raw, steamed, roasted, or grilled). Low-sodium or reduced-sodium tomato and vegetable juices. Low-sodium or reduced-sodium tomato sauce and paste. Low-sodium or reduced-sodium canned vegetables.  Fruits All fresh, canned (in natural juice), or frozen fruits. Meat and Other Protein Products Ground beef (85% or leaner), grass-fed beef, or beef trimmed of fat. Skinless chicken or turkey. Ground chicken or turkey. Pork trimmed of fat. All fish and seafood. Eggs. Dried beans, peas, or lentils. Unsalted nuts and seeds. Unsalted canned beans. Dairy Low-fat dairy products, such as skim or 1% milk, 2% or reduced-fat cheeses, low-fat ricotta or cottage cheese, or plain low-fat yogurt. Low-sodium or reduced-sodium cheeses. Fats and Oils Tub margarines without trans fats. Light or reduced-fat mayonnaise and salad dressings (reduced sodium). Avocado. Safflower, olive, or canola oils. Natural peanut or almond butter. Other Unsalted popcorn and pretzels. The items listed above may not be a complete list of recommended foods or beverages. Contact your dietitian for more options. WHAT FOODS ARE NOT RECOMMENDED? Grains White bread. White pasta. White rice. Refined cornbread. Bagels and croissants. Crackers that contain trans fat. Vegetables Creamed or fried vegetables. Vegetables in a cheese sauce. Regular canned vegetables. Regular canned tomato sauce and paste. Regular tomato and vegetable juices. Fruits Dried fruits. Canned fruit in light or heavy syrup. Fruit juice. Meat and Other Protein Products Fatty cuts of meat. Ribs, chicken wings, bacon, sausage, bologna, salami, chitterlings, fatback, hot dogs, bratwurst, and packaged luncheon meats. Salted nuts and seeds. Canned beans with salt. Dairy Whole or 2% milk, cream, half-and-half, and cream cheese. Whole-fat or sweetened yogurt. Full-fat   cheeses or blue cheese. Nondairy creamers and whipped toppings. Processed cheese, cheese spreads, or cheese  curds. Condiments Onion and garlic salt, seasoned salt, table salt, and sea salt. Canned and packaged gravies. Worcestershire sauce. Tartar sauce. Barbecue sauce. Teriyaki sauce. Soy sauce, including reduced sodium. Steak sauce. Fish sauce. Oyster sauce. Cocktail sauce. Horseradish. Ketchup and mustard. Meat flavorings and tenderizers. Bouillon cubes. Hot sauce. Tabasco sauce. Marinades. Taco seasonings. Relishes. Fats and Oils Butter, stick margarine, lard, shortening, ghee, and bacon fat. Coconut, palm kernel, or palm oils. Regular salad dressings. Other Pickles and olives. Salted popcorn and pretzels. The items listed above may not be a complete list of foods and beverages to avoid. Contact your dietitian for more information. WHERE CAN I FIND MORE INFORMATION? National Heart, Lung, and Blood Institute: www.nhlbi.nih.gov/health/health-topics/topics/dash/ Document Released: 07/01/2011 Document Revised: 11/26/2013 Document Reviewed: 05/16/2013 ExitCare Patient Information 2015 [employer]. This information is not intended to replace advice given to you by your health care provider. Make sure you discuss any questions you have with your health care provider. Hypertension Hypertension, commonly called high blood pressure, is when the force of blood pumping through your arteries is too strong. Your arteries are the blood vessels that carry blood from your heart throughout your body. A blood pressure reading consists of a higher number over a lower number, such as 110/72. The higher number (systolic) is the pressure inside your arteries when your heart pumps. The lower number (diastolic) is the pressure inside your arteries when your heart relaxes. Ideally you want your blood pressure below 120/80. Hypertension forces your heart to work harder to pump blood. Your arteries may become narrow or stiff. Having hypertension puts you at risk for heart disease, stroke, and other problems.  RISK  FACTORS Some risk factors for high blood pressure are controllable. Others are not.  Risk factors you cannot control include:   Race. You may be at higher risk if you are African American.  Age. Risk increases with age.  Gender. Men are at higher risk than women before age 45 years. After age 65, women are at higher risk than men. Risk factors you can control include:  Not getting enough exercise or physical activity.  Being overweight.  Getting too much fat, sugar, calories, or salt in your diet.  Drinking too much alcohol. SIGNS AND SYMPTOMS Hypertension does not usually cause signs or symptoms. Extremely high blood pressure (hypertensive crisis) may cause headache, anxiety, shortness of breath, and nosebleed. DIAGNOSIS  To check if you have hypertension, your health care provider will measure your blood pressure while you are seated, with your arm held at the level of your heart. It should be measured at least twice using the same arm. Certain conditions can cause a difference in blood pressure between your right and left arms. A blood pressure reading that is higher than normal on one occasion does not mean that you need treatment. If one blood pressure reading is high, ask your health care provider about having it checked again. TREATMENT  Treating high blood pressure includes making lifestyle changes and possibly taking medicine. Living a healthy lifestyle can help lower high blood pressure. You may need to change some of your habits. Lifestyle changes may include:  Following the DASH diet. This diet is high in fruits, vegetables, and whole grains. It is low in salt, red meat, and added sugars.  Getting at least 2 hours of brisk physical activity every week.  Losing weight if necessary.  Not smoking.  Limiting   alcoholic beverages.  Learning ways to reduce stress. If lifestyle changes are not enough to get your blood pressure under control, your health care provider may  prescribe medicine. You may need to take more than one. Work closely with your health care provider to understand the risks and benefits. HOME CARE INSTRUCTIONS  Have your blood pressure rechecked as directed by your health care provider.   Take medicines only as directed by your health care provider. Follow the directions carefully. Blood pressure medicines must be taken as prescribed. The medicine does not work as well when you skip doses. Skipping doses also puts you at risk for problems.   Do not smoke.   Monitor your blood pressure at home as directed by your health care provider. SEEK MEDICAL CARE IF:   You think you are having a reaction to medicines taken.  You have recurrent headaches or feel dizzy.  You have swelling in your ankles.  You have trouble with your vision. SEEK IMMEDIATE MEDICAL CARE IF:  You develop a severe headache or confusion.  You have unusual weakness, numbness, or feel faint.  You have severe chest or abdominal pain.  You vomit repeatedly.  You have trouble breathing. MAKE SURE YOU:   Understand these instructions.  Will watch your condition.  Will get help right away if you are not doing well or get worse. Document Released: 07/12/2005 Document Revised: 11/26/2013 Document Reviewed: 05/04/2013 ExitCare Patient Information 2015 [employer]. This information is not intended to replace advice given to you by your health care provider. Make sure you discuss any questions you have with your health care provider.  

## 2015-02-06 NOTE — Progress Notes (Signed)
Patient ID: Jamie [name], female   DOB: 03/23/1956, 59 y.o.   MRN: [id number]   Jamie [name], is a 59 y.o. female  YOV:[id number]  [id number]  DOB - 12/02/1955  Chief Complaint  Patient presents with  . Annual Exam        Subjective:   Jamie [name] is a 59 y.o. female here today for a follow up visit. Patient has a history of hypothyroidism, hypertension, diabetes mellitus and osteoarthritis. Patient has no new complaint today except that she has not been taking her metformin due to side effects of diarrhea. She has been off metformin for over 2 months. She has no new complaints today. She requests refill on her medications and looking to start alternative medication to metformin. Blood pressure is controlled, she does not check her blood sugar. She claims she has been eating healthy and doing exercise around the neighborhood. Patient has No headache, No chest pain, No abdominal pain - No Nausea, No new weakness tingling or numbness, No Cough - SOB.  Problem  Type 2 Diabetes Mellitus Without Complications    ALLERGIES: No Known Allergies  PAST MEDICAL HISTORY: Past Medical History  Diagnosis Date  . Thyroid disease   . Hypertension   . Uterine enlargement   . Diabetes mellitus   . Osteopenia   . Arthritis   . Cancer     thyroid  . Uterine enlargement     MEDICATIONS AT HOME: Prior to Admission medications   Medication Sig Start Date End Date Taking? Authorizing Provider  acetaminophen (TYLENOL) 500 MG tablet Take 500 mg by mouth every 6 (six) hours as needed.   Yes Historical Provider, MD  levothyroxine (SYNTHROID, LEVOTHROID) 150 MCG tablet Take 1 tablet (150 mcg total) by mouth daily. 02/06/15  Yes Lincoln Kleiner Essie Christine, MD  traMADol (ULTRAM) 50 MG tablet Take 1 tablet (50 mg total) by mouth every 6 (six) hours as needed. 08/22/13  Yes Reyne Dumas, MD  triamterene-hydrochlorothiazide (MAXZIDE-25) 37.5-25 MG per tablet Take 1 tablet by mouth daily. 02/06/15  Yes Tresa Garter, MD    allopurinol (ZYLOPRIM) 300 MG tablet Take 1 tablet (300 mg total) by mouth daily. Patient not taking: Reported on 08/08/2014 07/25/14   Tresa Garter, MD  benzocaine-menthol (CHLORAEPTIC) 6-10 MG lozenge Take 1 lozenge by mouth as needed for sore throat. Patient not taking: Reported on 02/06/2015 08/08/14   Tresa Garter, MD  colchicine 0.6 MG tablet Take 1 tablet (0.6 mg total) by mouth daily. Patient not taking: Reported on 08/08/2014 07/25/14   Tresa Garter, MD  indomethacin (INDOCIN) 50 MG capsule Take 1 capsule (50 mg total) by mouth 2 (two) times daily with a meal. Patient not taking: Reported on 08/08/2014 07/25/14   Tresa Garter, MD  metFORMIN (GLUCOPHAGE) 500 MG tablet Take 1 tablet (500 mg total) by mouth 2 (two) times daily with a meal. Patient not taking: Reported on 08/08/2014 07/25/14   Tresa Garter, MD  methocarbamol (ROBAXIN) 500 MG tablet Take 1 tablet (500 mg total) by mouth 2 (two) times daily. Patient not taking: Reported on 07/25/2014 08/22/13   Reyne Dumas, MD  polyethylene glycol (MIRALAX / GLYCOLAX) packet Take 17 g by mouth daily as needed. Patient not taking: Reported on 02/06/2015 12/04/14   Waldemar Dickens, MD  senna (SENEXON) 8.6 MG tablet Take 1 tablet (8.6 mg total) by mouth daily. Patient not taking: Reported on 02/06/2015 12/04/14   Waldemar Dickens, MD  Vitamin D, Ergocalciferol, (DRISDOL) 50000  UNITS CAPS capsule Take 1 capsule (50,000 Units total) by mouth every 7 (seven) days. Patient not taking: Reported on 06/25/2014 10/22/13   Tresa Garter, MD     Objective:   Filed Vitals:   02/06/15 1412  BP: 127/90  Pulse: 77  Temp: 98 F (36.7 C)  TempSrc: Oral  Resp: 16  Height: 4' 11.25" (1.505 m)  Weight: 112 lb 6.4 oz (50.984 kg)  SpO2: 97%    Exam General appearance : Awake, alert, not in any distress. Speech Clear. Not toxic looking HEENT: Atraumatic and Normocephalic, pupils equally reactive to light and  accomodation Neck: supple, no JVD. No cervical lymphadenopathy.  Chest:Good air entry bilaterally, no added sounds  CVS: S1 S2 regular, no murmurs.  Abdomen: Bowel sounds present, Non tender and not distended with no gaurding, rigidity or rebound. Extremities: B/L Lower Ext shows no edema, both legs are warm to touch Neurology: Awake alert, and oriented X 3, CN II-XII intact, Non focal Skin:No Rash  Data Review Lab Results  Component Value Date   HGBA1C 5.80 02/06/2015   HGBA1C 5.90 07/25/2014   HGBA1C 6.6 01/22/2014     Assessment & Plan   1. Type 2 diabetes mellitus without complications  - Glucose (CBG) - HgB A1c is 5.8% today without medications Stop Metformin Repeat A1C in 3 months, if > 6.5%, will restart anti-diabetics: Glipizide or Glucotrol   Aim for 30 minutes of exercise most days. Rethink what you drink. Water is great! Aim for 2-3 Carb Choices per meal (30-45 grams) +/- 1 either way  Aim for 0-15 Carbs per snack if hungry  Include protein in moderation with your meals and snacks  Consider reading food labels for Total Carbohydrate and Fat Grams of foods  Be mindful about how much sugar you are adding to beverages and other foods. Try to decrease. Consider splenda. Fruit Punch - find one with no sugar  Measure and decrease portions of carbohydrate foods  Make your plate and don't go back for seconds   2. Other specified hypothyroidism  - levothyroxine (SYNTHROID, LEVOTHROID) 150 MCG tablet; Take 1 tablet (150 mcg total) by mouth daily.  Dispense: 90 tablet; Refill: 3  3. Essential hypertension Refill - triamterene-hydrochlorothiazide (MAXZIDE-25) 37.5-25 MG per tablet; Take 1 tablet by mouth daily.  Dispense: 90 tablet; Refill: 3  - We have discussed target BP range and blood pressure goal - I have advised patient to check BP regularly and to call us back or report to clinic if the numbers are consistently higher than 140/90  - We discussed the  importance of compliance with medical therapy and DASH diet recommended, consequences of uncontrolled hypertension discussed.  - continue current BP medications Patient have been counseled extensively about nutrition and exercise  Interpreter was used to communicate directly with patient for the entire encounter including providing detailed patient instructions.   Return in about 3 months (around 05/09/2015) for Follow up HTN, Hypothyroidism, Annual Physical.  The patient was given clear instructions to go to ER or return to medical center if symptoms don't improve, worsen or new problems develop. The patient verbalized understanding. The patient was told to call to get lab results if they haven't heard anything in the next week.   This note has been created with Surveyor, quantity. Any transcriptional errors are unintentional.    Angelica Chessman, MD, [place], [place], [place], [place] and [facility] [address], [place]   02/06/2015,  3:03 PM

## 2015-02-06 NOTE — Progress Notes (Signed)
Patient here for complete check up excluding pelvic exam. Patient reports she feels fine today and denies any pain. Patient CBG is 143 and A1C is 5.8.   Patient reports she is not taking metformin because it causes her to have diarrhea. It has been two months since her last use. Patient would like to see if there is another medication she can take for diabetes instead of the metformin. Patient needs a refill on synthroid and triamterene-hydrochlorothiazide medication.

## 2015-02-07 ENCOUNTER — Other Ambulatory Visit: Payer: Self-pay | Admitting: Internal Medicine

## 2015-02-07 DIAGNOSIS — E038 Other specified hypothyroidism: Secondary | ICD-10-CM

## 2015-02-07 MED ORDER — LEVOTHYROXINE SODIUM 150 MCG PO TABS: 150.0000 ug | ORAL_TABLET | Freq: Every day | ORAL | Status: DC

## 2015-07-29 MED FILL — ?TRIAMTERENE/HCTZ 37.5/25TB: 37.5-25 | 30 days supply | Qty: 30 | Fill #5

## 2015-07-29 MED FILL — $Synthroid 150mcg tablet: 150 | 90 days supply | Qty: 90 | Fill #0

## 2015-09-10 MED FILL — ?TRIAMTERENE/HCTZ 37.5/25TB: 37.5-25 | 30 days supply | Qty: 30 | Fill #6

## 2015-10-07 ENCOUNTER — Ambulatory Visit: Payer: Self-pay | Attending: Internal Medicine

## 2015-10-07 MED FILL — ?ALLOPURINOL 100MG TABLET: 100 | 30 days supply | Qty: 60 | Fill #0

## 2015-10-07 MED FILL — COLCHICINE 0.6 MG TABLET: 0.6 | 30 days supply | Qty: 60 | Fill #0

## 2015-10-14 MED FILL — ?TRIAMTERENE/HCTZ 37.5/25TB: 37.5-25 | 30 days supply | Qty: 30 | Fill #7

## 2015-10-16 ENCOUNTER — Ambulatory Visit: Payer: Self-pay | Admitting: Internal Medicine

## 2015-10-27 MED FILL — $Synthroid 150mcg tablet: 150 | 90 days supply | Qty: 90 | Fill #1

## 2015-11-17 MED FILL — ?TRIAMTERENE/HCTZ 37.5/25TB: 37.5-25 | 30 days supply | Qty: 30 | Fill #8

## 2015-12-23 MED FILL — ?TRIAMTERENE/HCTZ 37.5/25TB: 37.5-25 | 30 days supply | Qty: 30 | Fill #9

## 2016-01-28 MED FILL — ?TRIAMTERENE/HCTZ 37.5/25TB: 37.5-25 | 30 days supply | Qty: 30 | Fill #10

## 2016-01-28 MED FILL — !SYNTHROID 150 MCG TABLET: 150 | 90 days supply | Qty: 90 | Fill #2

## 2016-02-26 ENCOUNTER — Other Ambulatory Visit: Payer: Self-pay | Admitting: Internal Medicine

## 2016-02-26 DIAGNOSIS — I10 Essential (primary) hypertension: Secondary | ICD-10-CM

## 2016-02-26 MED FILL — ?TRIAMTERENE/HCTZ 37.5/25TB: 37.5-25 | 30 days supply | Qty: 30 | Fill #0

## 2016-02-26 MED FILL — ?ALLOPURINOL 100MG TABLET: 100 | 30 days supply | Qty: 60 | Fill #1

## 2016-04-01 ENCOUNTER — Other Ambulatory Visit: Payer: Self-pay | Admitting: Internal Medicine

## 2016-04-01 DIAGNOSIS — I10 Essential (primary) hypertension: Secondary | ICD-10-CM

## 2016-04-07 ENCOUNTER — Other Ambulatory Visit: Payer: Self-pay | Admitting: Internal Medicine

## 2016-04-07 DIAGNOSIS — I10 Essential (primary) hypertension: Secondary | ICD-10-CM

## 2016-04-13 ENCOUNTER — Other Ambulatory Visit: Payer: Self-pay | Admitting: Internal Medicine

## 2016-04-13 DIAGNOSIS — I10 Essential (primary) hypertension: Secondary | ICD-10-CM

## 2016-04-14 MED FILL — ALLOPURINOL 100 MG TABLET: 100 | 30 days supply | Qty: 30 | Fill #0

## 2016-04-14 MED FILL — ?LISINOPRIL 5 MG TABLET: 5 | 30 days supply | Qty: 30 | Fill #0

## 2016-04-14 MED FILL — SYNTHROID 150 MCG TABLET: 150 | 30 days supply | Qty: 30 | Fill #0

## 2016-05-11 MED FILL — ?LISINOPRIL 5 MG TABLET: 5 | 30 days supply | Qty: 30 | Fill #1

## 2016-05-19 MED FILL — LEVOTHYROXINE 150 MCG TAB: 150 | 30 days supply | Qty: 30 | Fill #1

## 2016-05-19 MED FILL — ?AMLODIPINE BESYLATE 5 MG T: 5 | 30 days supply | Qty: 30 | Fill #0

## 2016-06-11 MED FILL — ?AMLODIPINE BESYLATE 5 MG T: 5 | 30 days supply | Qty: 30 | Fill #1

## 2016-06-21 MED FILL — LEVOTHYROXINE 150 MCG TAB: 150 | 30 days supply | Qty: 30 | Fill #2

## 2016-07-13 MED FILL — LEVOTHYROXINE 150 MCG TAB: 150 | 30 days supply | Qty: 30 | Fill #3

## 2016-08-03 MED FILL — ?AMLODIPINE BESYLATE 5 MG T: 5 | 30 days supply | Qty: 30 | Fill #0

## 2016-08-26 MED FILL — LEVOTHYROXINE 150 MCG TAB: 150 | 30 days supply | Qty: 30 | Fill #0

## 2016-09-07 MED FILL — AMLODIPINE BESYLATE 5 MG TA: 5 | 30 days supply | Qty: 30 | Fill #1

## 2016-09-27 MED FILL — ALLOPURINOL 100 MG TABLET: 100 | 30 days supply | Qty: 30 | Fill #1

## 2016-09-28 MED FILL — LEVOTHYROXINE 150 MCG TAB: 150 | 30 days supply | Qty: 30 | Fill #1

## 2016-10-06 MED FILL — AMLODIPINE BESYLATE 5 MG TA: 5 | 30 days supply | Qty: 30 | Fill #2

## 2016-11-01 MED FILL — LEVOTHYROXINE 150 MCG TAB: 150 | 30 days supply | Qty: 30 | Fill #2

## 2016-11-09 MED FILL — AMLODIPINE BESYLATE 5 MG TA: 5 | 30 days supply | Qty: 30 | Fill #3

## 2016-11-29 MED FILL — LEVOTHYROXINE 150 MCG TAB: 150 | 30 days supply | Qty: 30 | Fill #3

## 2016-11-29 MED FILL — AMLODIPINE BESYLATE 5 MG TA: 5 | 30 days supply | Qty: 30 | Fill #4

## 2017-01-04 MED FILL — LEVOTHYROXINE 150 MCG TAB: 150 | 30 days supply | Qty: 30 | Fill #4

## 2017-01-04 MED FILL — AMLODIPINE BESYLATE 5 MG TA: 5 | 30 days supply | Qty: 30 | Fill #5

## 2017-02-02 MED FILL — AMLODIPINE BESYLATE 5 MG TA: 5 | 30 days supply | Qty: 30 | Fill #6

## 2017-02-02 MED FILL — LEVOTHYROXINE 150 MCG TAB: 150 | 30 days supply | Qty: 30 | Fill #5

## 2017-02-10 ENCOUNTER — Other Ambulatory Visit: Payer: Self-pay | Admitting: Surgery

## 2017-02-10 DIAGNOSIS — R221 Localized swelling, mass and lump, neck: Secondary | ICD-10-CM

## 2017-03-02 ENCOUNTER — Ambulatory Visit
Admission: RE | Admit: 2017-03-02 | Discharge: 2017-03-02 | Disposition: A | Discharge status: Home / Self Care | Payer: No Typology Code available for payment source | Source: Ambulatory Visit | Attending: Surgery | Admitting: Surgery

## 2017-03-02 DIAGNOSIS — R221 Localized swelling, mass and lump, neck: Secondary | ICD-10-CM

## 2017-03-02 MED ORDER — IOPAMIDOL (ISOVUE-300) INJECTION 61%
75.0000 mL | Freq: Once | INTRAVENOUS | Status: AC | PRN
  Administered 2017-03-02: 75 mL via INTRAVENOUS

## 2017-03-03 MED FILL — LEVOTHYROXINE 150 MCG TAB: 150 | 30 days supply | Qty: 30 | Fill #6

## 2017-03-14 MED FILL — ALLOPURINOL 100 MG TABLET: 100 | 30 days supply | Qty: 30 | Fill #2

## 2017-03-30 DIAGNOSIS — Z85850 Personal history of malignant neoplasm of thyroid: Secondary | ICD-10-CM | POA: Insufficient documentation

## 2017-04-01 ENCOUNTER — Other Ambulatory Visit (HOSPITAL_COMMUNITY): Payer: Self-pay | Admitting: Otolaryngology

## 2017-04-01 DIAGNOSIS — R221 Localized swelling, mass and lump, neck: Secondary | ICD-10-CM

## 2017-04-06 MED FILL — ALLOPURINOL 100 MG TABLET: 100 | 30 days supply | Qty: 30 | Fill #0

## 2017-04-06 MED FILL — AMLODIPINE BESYLATE 5 MG TA: 5 | 30 days supply | Qty: 30 | Fill #0

## 2017-04-06 MED FILL — LEVOTHYROXINE 150 MCG TAB: 150 | 30 days supply | Qty: 30 | Fill #0

## 2017-04-11 ENCOUNTER — Ambulatory Visit (HOSPITAL_COMMUNITY): Payer: Self-pay

## 2017-04-20 ENCOUNTER — Other Ambulatory Visit: Payer: Self-pay | Admitting: Student

## 2017-04-20 ENCOUNTER — Other Ambulatory Visit: Payer: Self-pay | Admitting: Radiology

## 2017-04-21 ENCOUNTER — Encounter (HOSPITAL_COMMUNITY): Payer: Self-pay

## 2017-04-21 ENCOUNTER — Other Ambulatory Visit (HOSPITAL_COMMUNITY): Payer: Self-pay | Admitting: Otolaryngology

## 2017-04-21 ENCOUNTER — Ambulatory Visit (HOSPITAL_COMMUNITY)
Admission: RE | Admit: 2017-04-21 | Discharge: 2017-04-21 | Disposition: A | Discharge status: Home / Self Care | Payer: Self-pay | Source: Ambulatory Visit | Attending: Otolaryngology | Admitting: Otolaryngology

## 2017-04-21 DIAGNOSIS — M8580 Other specified disorders of bone density and structure, unspecified site: Secondary | ICD-10-CM | POA: Insufficient documentation

## 2017-04-21 DIAGNOSIS — R221 Localized swelling, mass and lump, neck: Secondary | ICD-10-CM

## 2017-04-21 DIAGNOSIS — E119 Type 2 diabetes mellitus without complications: Secondary | ICD-10-CM | POA: Insufficient documentation

## 2017-04-21 DIAGNOSIS — C73 Malignant neoplasm of thyroid gland: Secondary | ICD-10-CM | POA: Insufficient documentation

## 2017-04-21 DIAGNOSIS — Z8589 Personal history of malignant neoplasm of other organs and systems: Secondary | ICD-10-CM | POA: Insufficient documentation

## 2017-04-21 DIAGNOSIS — I10 Essential (primary) hypertension: Secondary | ICD-10-CM | POA: Insufficient documentation

## 2017-04-21 LAB — CBC
HCT: 40.1 % (ref 36.0–46.0)
Hemoglobin: 13.6 g/dL (ref 12.0–15.0)
MCH: 28.6 pg (ref 26.0–34.0)
MCHC: 33.9 g/dL (ref 30.0–36.0)
MCV: 84.2 fL (ref 78.0–100.0)
PLATELETS: 256 10*3/uL (ref 150–400)
RBC: 4.76 MIL/uL (ref 3.87–5.11)
RDW: 12.2 % (ref 11.5–15.5)
WBC: 6.1 10*3/uL (ref 4.0–10.5)

## 2017-04-21 LAB — PROTIME-INR
INR: 0.96
Prothrombin Time: 12.7 seconds (ref 11.4–15.2)

## 2017-04-21 LAB — APTT: aPTT: 30 seconds (ref 24–36)

## 2017-04-21 LAB — GLUCOSE, CAPILLARY: Glucose-Capillary: 114 mg/dL — ABNORMAL HIGH (ref 65–99)

## 2017-04-21 MED ORDER — MIDAZOLAM HCL 2 MG/2ML IJ SOLN
INTRAMUSCULAR | Status: AC
  Filled 2017-04-21: qty 2

## 2017-04-21 MED ORDER — LIDOCAINE HCL (PF) 1 % IJ SOLN
INTRAMUSCULAR | Status: AC
  Filled 2017-04-21: qty 5

## 2017-04-21 MED ORDER — FENTANYL CITRATE (PF) 100 MCG/2ML IJ SOLN
INTRAMUSCULAR | Status: AC
  Filled 2017-04-21: qty 2

## 2017-04-21 MED ORDER — MIDAZOLAM HCL 2 MG/2ML IJ SOLN
INTRAMUSCULAR | Status: AC | PRN
  Administered 2017-04-21: 1 mg via INTRAVENOUS

## 2017-04-21 MED ORDER — SODIUM CHLORIDE 0.9 % IV SOLN: INTRAVENOUS | Status: DC

## 2017-04-21 MED ORDER — FENTANYL CITRATE (PF) 100 MCG/2ML IJ SOLN
INTRAMUSCULAR | Status: AC | PRN
  Administered 2017-04-21: 50 ug via INTRAVENOUS

## 2017-04-21 NOTE — Sedation Documentation (Signed)
Patient is resting comfortably. 

## 2017-04-21 NOTE — Discharge Instructions (Signed)
Needle Biopsy, Care After °These instructions give you information about caring for yourself after your procedure. Your doctor may also give you more specific instructions. Call your doctor if you have any problems or questions after your procedure. °Follow these instructions at home: °· Rest as told by your doctor. °· Take medicines only as told by your doctor. °· There are many different ways to close and cover the biopsy site, including stitches (sutures), skin glue, and adhesive strips. Follow instructions from your doctor about: °? How to take care of your biopsy site. °? When and how you should change your bandage (dressing). °? When you should remove your dressing. °? Removing whatever was used to close your biopsy site. °· Check your biopsy site every day for signs of infection. Watch for: °? Redness, swelling, or pain. °? Fluid, blood, or pus. °Contact a doctor if: °· You have a fever. °· You have redness, swelling, or pain at the biopsy site, and it lasts longer than a few days. °· You have fluid, blood, or pus coming from the biopsy site. °· You feel sick to your stomach (nauseous). °· You throw up (vomit). °Get help right away if: °· You are short of breath. °· You have trouble breathing. °· Your chest hurts. °· You feel dizzy or you pass out (faint). °· You have bleeding that does not stop with pressure or a bandage. °· You cough up blood. °· Your belly (abdomen) hurts. °This information is not intended to replace advice given to you by your health care provider. Make sure you discuss any questions you have with your health care provider. °Document Released: 06/24/2008 Document Revised: 12/18/2015 Document Reviewed: 07/08/2014 °Elsevier Interactive Patient Education © 2018 [employer] Inc. ° °

## 2017-04-21 NOTE — H&P (Signed)
Chief Complaint: left neck mass  Referring Physician:Dr. Jerrell Belfast  Supervising Physician: Corrie Mckusick  Patient Status: [facility] - Out-pt  HPI: Jamie [name] is a 61 y.o. female with a history of thyroid cancer s/p thyroidectomy in 2012.  She recently felt a lump on her neck and had this worked up.  She has a CT scan that reveals a large LN concerning for recurrent thyroid cancer.  She was recently seen by Dr. Wilburn Cornelia and a request for a biopsy of this area.  Past Medical History:  Past Medical History:  Diagnosis Date  . Arthritis   . Cancer ([place])    thyroid  . Diabetes mellitus   . Hypertension   . Osteopenia   . Thyroid disease   . Uterine enlargement   . Uterine enlargement     Past Surgical History:  Past Surgical History:  Procedure Laterality Date  . COLONOSCOPY  06/15/2011   Procedure: COLONOSCOPY;  Surgeon: Jeryl Columbia, MD;  Location: [facility] ENDOSCOPY;  Service: Endoscopy;  Laterality: N/A;  . THYROID LOBECTOMY    . TOTAL THYROIDECTOMY  2012    Family History:  Family History  Problem Relation Age of Onset  . Cancer Father        lung    Social History:  reports that she has never smoked. She has never used smokeless tobacco. She reports that she does not drink alcohol or use drugs.  Allergies: No Known Allergies  Medications: Medications reviewed in epic  Please HPI for pertinent positives, otherwise complete 10 system ROS negative.  Mallampati Score: MD Evaluation Airway: WNL Heart: WNL Abdomen: WNL Chest/ Lungs: WNL ASA  Classification: 2 Mallampati/Airway Score: Two  Physical Exam: BP (!) 136/94   Pulse 94   Temp 97.9 F (36.6 C) (Oral)   Resp 16   Ht 4\' 11"  (1.499 m)   Wt 114 lb (51.7 kg)   SpO2 97%   BMI 23.03 kg/m  Body mass index is 23.03 kg/m. General: pleasant, WD, WN [place] female who is laying in bed in NAD HEENT: head is normocephalic, atraumatic.  Sclera are noninjected.  PERRL.  Ears and nose without any masses or  lesions.  Mouth is pink and moist.  Palpable LN in left neck Heart: regular, rate, and rhythm.  Normal s1,s2. No obvious murmurs, gallops, or rubs noted.  Palpable radial and pedal pulses bilaterally Lungs: CTAB, no wheezes, rhonchi, or rales noted.  Respiratory effort nonlabored Abd: soft, NT, ND, +BS, no masses, hernias, or organomegaly Psych: A&Ox3 with an appropriate affect.   Labs: Results for orders placed or performed during the hospital encounter of 04/21/17 (from the past 48 hour(s))  APTT upon arrival     Status: None   Collection Time: 04/21/17  7:15 AM  Result Value Ref Range   aPTT 30 24 - 36 seconds  CBC upon arrival     Status: None   Collection Time: 04/21/17  7:15 AM  Result Value Ref Range   WBC 6.1 4.0 - 10.5 K/uL   RBC 4.76 3.87 - 5.11 MIL/uL   Hemoglobin 13.6 12.0 - 15.0 g/dL   HCT 40.1 36.0 - 46.0 %   MCV 84.2 78.0 - 100.0 fL   MCH 28.6 26.0 - 34.0 pg   MCHC 33.9 30.0 - 36.0 g/dL   RDW 12.2 11.5 - 15.5 %   Platelets 256 150 - 400 K/uL  Protime-INR upon arrival     Status: None   Collection Time: 04/21/17  7:15 AM  Result Value Ref Range   Prothrombin Time 12.7 11.4 - 15.2 seconds   INR 0.96     Imaging: No results found.  Assessment/Plan 1. Lymphadenopathy of left neck  Patient has a history of thyroid cancer with an enlarged LN.  She presents today for biopsy.  Labs and vitals reviewed.  Risks and benefits discussed with the patient including, but not limited to bleeding, infection, damage to adjacent structures or low yield requiring additional tests. All of the patient's questions were answered, patient is agreeable to proceed. Consent signed and in chart.  Thank you for this interesting consult.  I greatly enjoyed meeting Jamie [name] and look forward to participating in their care.  A copy of this report was sent to the requesting provider on this date.  Electronically Signed: Henreitta Cea 04/21/2017, 7:50 AM   I spent a total of  30 Minutes    in face to face in clinical consultation, greater than 50% of which was counseling/coordinating care for lymphadenopathy, left neck

## 2017-04-21 NOTE — Procedures (Signed)
Interventional Radiology Procedure Note  Procedure:  US guided FNA of left deep neck node.   Complications: None  Recommendations:  - 1 hour recovery - Routine wound care - Do not submerge for 7 days    Signed,  Dulcy Fanny. Earleen Newport, DO

## 2017-04-27 ENCOUNTER — Ambulatory Visit: Payer: Self-pay

## 2017-04-29 MED FILL — AMLODIPINE BESYLATE 5 MG TA: 5 | 30 days supply | Qty: 30 | Fill #1

## 2017-04-29 MED FILL — LEVOTHYROXINE 150 MCG TAB: 150 | 30 days supply | Qty: 30 | Fill #1

## 2017-05-17 MED FILL — ?ALLOPURINOL 100MG TABLET: 100 | 30 days supply | Qty: 30 | Fill #1

## 2017-05-26 ENCOUNTER — Ambulatory Visit (HOSPITAL_COMMUNITY)
Admission: EM | Admit: 2017-05-26 | Discharge: 2017-05-26 | Disposition: A | Discharge status: Home / Self Care | Payer: Self-pay | Attending: Emergency Medicine | Admitting: Emergency Medicine

## 2017-05-26 ENCOUNTER — Encounter (HOSPITAL_COMMUNITY): Payer: Self-pay | Admitting: Family Medicine

## 2017-05-26 DIAGNOSIS — J020 Streptococcal pharyngitis: Secondary | ICD-10-CM

## 2017-05-26 LAB — POCT RAPID STREP A: Streptococcus, Group A Screen (Direct): POSITIVE — AB

## 2017-05-26 MED ORDER — AMOXICILLIN 500 MG PO CAPS
1000.0000 mg | ORAL_CAPSULE | Freq: Two times a day (BID) | ORAL | 0 refills | Status: DC
Start: 2017-05-26 — End: 2018-02-25

## 2017-05-26 NOTE — Discharge Instructions (Signed)
Motrin 600 mg every 6 hours as needed for pain and fever. May alternate with Tylenol every 4 hours. Cepacol lozenges for sore throat pain.Drink plenty of fluids and stay well-hydrated.Take the antibiotic as directed

## 2017-05-26 NOTE — ED Provider Notes (Signed)
[place]    CSN: [id number] Arrival date & time: 05/26/17  [place]     History   Chief Complaint Chief Complaint  Patient presents with  . Fever  . Neck Pain    HPI Sherrin Deleeuw is a 61 y.o. female.    36 one year-old [place] female accompanied by English-speaking significant other presents with complaints of headache and fever and body aches with sore throat for 2 days. Fever on arrival 100.3. Tylenol dose around 5:30 or 2 hours ago.  Denies earache chest pain, shortness of ath, cough, abdominal pain, GI or urinary symptoms.      Past Medical History:  Diagnosis Date  . Arthritis   . Cancer ([place])    thyroid  . Diabetes mellitus   . Hypertension   . Osteopenia   . Thyroid disease   . Uterine enlargement   . Uterine enlargement     Patient Active Problem List   Diagnosis Date Noted  . Type 2 diabetes mellitus without complications ([place]) 78/29/5621  . Essential hypertension 07/25/2014  . Palpitations 07/25/2014  . Other specified hypothyroidism 07/25/2014  . Prediabetes 07/25/2014  . Type 2 diabetes mellitus without complication ([place]) 30/86/5784  . Type 2 diabetes mellitus with hyperglycemia ([place]) 01/22/2014  . 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 Back pain 10/22/2013  . Unspecified hypothyroidism 10/22/2013  . HTN (hypertension) 10/22/2013  . Hypovitaminosis D 10/22/2013  . Thyroid cancer ([place]) 03/08/2011  . GERD 10/06/2010  . CALLUSES, FEET, BILATERAL 07/23/2010  . COLONIC POLYPS, HX OF 04/24/2010  . UTI 03/05/2010  . DIABETES MELLITUS, MILD 12/20/2008  . BLOOD IN STOOL 12/20/2008  . HYPERTENSION 07/27/2003  . BENIGN NEOPLASM OF THYROID GLANDS 11/29/2001    Past Surgical History:  Procedure Laterality Date  . COLONOSCOPY  06/15/2011   Procedure: COLONOSCOPY;  Surgeon: Jeryl Columbia, MD;  Location: [facility] ENDOSCOPY;  Service: Endoscopy;  Laterality: N/A;  . THYROID LOBECTOMY    . TOTAL THYROIDECTOMY  2012    OB History    No  data available       Home Medications    Prior to Admission medications   Medication Sig Start Date End Date Taking? Authorizing Provider  acetaminophen (TYLENOL) 500 MG tablet Take 500 mg by mouth every 6 (six) hours as needed.    [provider]  allopurinol (ZYLOPRIM) 300 MG tablet Take 1 tablet (300 mg total) by mouth daily. 07/25/14   Tresa Garter, MD  amLODipine (NORVASC) 5 MG tablet Take 5 mg by mouth daily.    [provider]  amoxicillin (AMOXIL) 500 MG capsule Take 2 capsules (1,000 mg total) by mouth 2 (two) times daily. 05/26/17   Janne Napoleon, NP  levothyroxine (SYNTHROID, LEVOTHROID) 150 MCG tablet Take 1 tablet (150 mcg total) by mouth daily. 02/07/15   Tresa Garter, MD  traMADol (ULTRAM) 50 MG tablet Take 1 tablet (50 mg total) by mouth every 6 (six) hours as needed. 08/22/13   Reyne Dumas, MD  triamterene-hydrochlorothiazide (MAXZIDE-25) 37.5-25 MG tablet TAKE 1 TABLET BY MOUTH DAILY. 02/26/16   Tresa Garter, MD    Family History Family History  Problem Relation Age of Onset  . Cancer Father        lung    Social History Social History  Substance Use Topics  . Smoking status: Never Smoker  . Smokeless tobacco: Never Used  . Alcohol use No     Allergies   Patient has no  known allergies.   Review of Systems Review of Systems  Constitutional: Positive for activity change, appetite change, fatigue and fever.  HENT: Positive for sore throat.   Respiratory: Negative for cough and shortness of breath.   Gastrointestinal: Negative.   Genitourinary: Negative.   Neurological: Negative.   All other systems reviewed and are negative.    Physical Exam Triage Vital Signs ED Triage Vitals [05/26/17 1929]  Enc Vitals Group     BP 116/73     Pulse Rate 98     Resp 18     Temp 100.3 F (37.9 C)     Temp src      SpO2 98 %     Weight      Height      Head Circumference      Peak Flow      Pain Score      Pain Loc        Pain Edu?      Excl. in [place]?    No data found.   Updated Vital Signs BP 116/73   Pulse 98   Temp 100.3 F (37.9 C)   Resp 18   SpO2 98%   Visual Acuity Right Eye Distance:   Left Eye Distance:   Bilateral Distance:    Right Eye Near:   Left Eye Near:    Bilateral Near:     Physical Exam  Constitutional: She appears well-developed and well-nourished. No distress.  HENT:  Head: Normocephalic and atraumatic.  Nose: Nose normal.  Bilateral TMs are normal. Oropharynx with deep erythema, no exudate.   Eyes: EOM are normal.  Neck: Normal range of motion. Neck supple.  Positive for bilateral anterior cervical lymphadenopathy. (Significant other states that she has had some "knots" in her neck along with the CT for evaluation. Unsure as to whether these "knots" are same as before. The patient states they are tender.) "   Cardiovascular: Normal rate, regular rhythm, normal heart sounds and intact distal pulses.   Pulmonary/Chest: Effort normal and breath sounds normal. No respiratory distress. She has no wheezes. She has no rales.  Musculoskeletal: She exhibits no edema.  Lymphadenopathy:    She has cervical adenopathy.  Neurological: She is alert. She exhibits normal muscle tone. Coordination normal.  Skin:  Skin warm and moist. Diaphoretic.  Psychiatric: She has a normal mood and affect.  Nursing note and vitals reviewed.    UC Treatments / Results  Labs (all labs ordered are listed, but only abnormal results are displayed) Labs Reviewed  POCT RAPID STREP A - Abnormal; Notable for the following:       Result Value   Streptococcus, Group A Screen (Direct) POSITIVE (*)    All other components within normal limits    EKG  EKG Interpretation None       Radiology No results found.  Procedures Procedures (including critical care time)  Medications Ordered in UC Medications - No data to display   Initial Impression / Assessment and Plan / UC Course  I  have reviewed the triage vital signs and the nursing notes.  Pertinent labs & imaging results that were available during my care of the patient were reviewed by me and considered in my medical decision making (see chart for details).    Motrin 600 mg every 6 hours as needed for pain and fever. May alternate with Tylenol every 4 hours. Cepacol lozenges for sore throat pain.Drink plenty of fluids and stay well-hydrated.Take the antibiotic as  directed    Final Clinical Impressions(s) / UC Diagnoses   Final diagnoses:  Strep pharyngitis    New Prescriptions New Prescriptions   AMOXICILLIN (AMOXIL) 500 MG CAPSULE    Take 2 capsules (1,000 mg total) by mouth 2 (two) times daily.     Controlled Substance Prescriptions [place] Controlled Substance Registry consulted? Not Applicable   Janina, [place], NP 05/26/17 2021

## 2017-05-26 NOTE — ED Triage Notes (Signed)
Pt here for fever since yesterday with throat pain.

## 2017-05-31 MED FILL — LEVOTHYROXINE 150 MCG TAB: 150 | 30 days supply | Qty: 30 | Fill #2

## 2017-05-31 MED FILL — AMLODIPINE BESYLATE 5 MG TA: 5 | 30 days supply | Qty: 30 | Fill #2

## 2017-07-08 MED FILL — LEVOTHYROXINE 150 MCG TAB: 150 | 30 days supply | Qty: 30 | Fill #3

## 2017-07-08 MED FILL — AMLODIPINE BESYLATE 5 MG TA: 5 | 30 days supply | Qty: 30 | Fill #3

## 2017-08-01 MED FILL — ?ALLOPURINOL 100 MG TABS: 100 | 30 days supply | Qty: 30 | Fill #2

## 2017-08-01 MED FILL — AMLODIPINE BESYLATE 5 MG TA: 5 | 30 days supply | Qty: 30 | Fill #4

## 2017-08-01 MED FILL — LEVOTHYROXINE 150 MCG TAB: 150 | 30 days supply | Qty: 30 | Fill #4

## 2017-09-06 MED FILL — LEVOTHYROXINE 150 MCG TAB: 150 | 30 days supply | Qty: 30 | Fill #5

## 2017-09-06 MED FILL — ?AMLODIPINE BESYLATE 5 MG T: 5 MG | 30 days supply | Qty: 30 | Fill #5

## 2017-09-06 MED FILL — ?ALLOPURINOL 100 MG TABS: 100 | 30 days supply | Qty: 30 | Fill #3

## 2017-10-04 MED FILL — LEVOTHYROXINE 150 MCG TAB: 150 | 30 days supply | Qty: 30 | Fill #6

## 2017-10-04 MED FILL — ?ALLOPURINOL 100 MG TABS: 100 | 30 days supply | Qty: 30 | Fill #4

## 2017-10-04 MED FILL — ?AMLODIPINE BESYLATE 5 MG T: 5 MG | 30 days supply | Qty: 30 | Fill #6

## 2017-10-26 MED FILL — ?METOPROLOL TARTRATE 25 MG: 25 | 30 days supply | Qty: 60 | Fill #0

## 2017-10-31 MED FILL — ?ALLOPURINOL 100 MG TABS: 100 | 30 days supply | Qty: 30 | Fill #0

## 2017-10-31 MED FILL — ?AMLODIPINE BESYLATE 5 MG T: 5 MG | 30 days supply | Qty: 30 | Fill #0

## 2017-10-31 MED FILL — LEVOTHYROXINE 150 MCG TAB: 150 | 30 days supply | Qty: 30 | Fill #0

## 2017-11-24 DIAGNOSIS — C73 Malignant neoplasm of thyroid gland: Secondary | ICD-10-CM | POA: Insufficient documentation

## 2017-11-28 MED FILL — ?METOPROLOL TARTRATE 25 MG: 25 | 30 days supply | Qty: 60 | Fill #1

## 2017-11-29 MED FILL — LEVOTHYROXINE 150 MCG TAB: 150 | 30 days supply | Qty: 30 | Fill #1

## 2017-11-29 MED FILL — AMLODIPINE BESYLATE 5 MG TA: 5 | 30 days supply | Qty: 30 | Fill #1

## 2018-01-02 MED FILL — ?AMLODIPINE BESYLATE 5 MG T: 5 MG | 30 days supply | Qty: 30 | Fill #2

## 2018-01-02 MED FILL — ?ALLOPURINOL 100MG TAB: 100 | 30 days supply | Qty: 30 | Fill #5

## 2018-01-02 MED FILL — LEVOTHYROXINE 150 MCG TAB: 150 | 30 days supply | Qty: 30 | Fill #2

## 2018-02-01 MED FILL — ?AMLODIPINE BESYLATE 5 MG T: 5 MG | 30 days supply | Qty: 30 | Fill #3

## 2018-02-01 MED FILL — LEVOTHYROXINE 150 MCG TAB: 150 | 30 days supply | Qty: 30 | Fill #3

## 2018-02-25 ENCOUNTER — Other Ambulatory Visit: Payer: Self-pay

## 2018-02-25 ENCOUNTER — Inpatient Hospital Stay (HOSPITAL_COMMUNITY)
Admission: EM | Admit: 2018-02-25 | Discharge: 2018-02-28 | DRG: 645 | Disposition: A | Discharge status: Home / Self Care | Payer: Self-pay | Attending: Cardiovascular Disease | Admitting: Cardiovascular Disease

## 2018-02-25 ENCOUNTER — Encounter (HOSPITAL_COMMUNITY): Payer: Self-pay | Admitting: Emergency Medicine

## 2018-02-25 ENCOUNTER — Emergency Department (HOSPITAL_COMMUNITY): Payer: Self-pay

## 2018-02-25 DIAGNOSIS — E1165 Type 2 diabetes mellitus with hyperglycemia: Secondary | ICD-10-CM | POA: Diagnosis present

## 2018-02-25 DIAGNOSIS — Z79899 Other long term (current) drug therapy: Secondary | ICD-10-CM

## 2018-02-25 DIAGNOSIS — I10 Essential (primary) hypertension: Secondary | ICD-10-CM | POA: Diagnosis present

## 2018-02-25 DIAGNOSIS — R591 Generalized enlarged lymph nodes: Secondary | ICD-10-CM | POA: Diagnosis present

## 2018-02-25 DIAGNOSIS — E876 Hypokalemia: Secondary | ICD-10-CM | POA: Diagnosis present

## 2018-02-25 DIAGNOSIS — R1310 Dysphagia, unspecified: Secondary | ICD-10-CM | POA: Diagnosis present

## 2018-02-25 DIAGNOSIS — C73 Malignant neoplasm of thyroid gland: Principal | ICD-10-CM | POA: Diagnosis present

## 2018-02-25 DIAGNOSIS — Z79890 Hormone replacement therapy (postmenopausal): Secondary | ICD-10-CM

## 2018-02-25 DIAGNOSIS — E890 Postprocedural hypothyroidism: Secondary | ICD-10-CM | POA: Diagnosis present

## 2018-02-25 DIAGNOSIS — M1990 Unspecified osteoarthritis, unspecified site: Secondary | ICD-10-CM | POA: Diagnosis present

## 2018-02-25 DIAGNOSIS — R590 Localized enlarged lymph nodes: Secondary | ICD-10-CM | POA: Diagnosis present

## 2018-02-25 DIAGNOSIS — T50905A Adverse effect of unspecified drugs, medicaments and biological substances, initial encounter: Secondary | ICD-10-CM | POA: Diagnosis present

## 2018-02-25 DIAGNOSIS — M8580 Other specified disorders of bone density and structure, unspecified site: Secondary | ICD-10-CM | POA: Diagnosis present

## 2018-02-25 DIAGNOSIS — Z801 Family history of malignant neoplasm of trachea, bronchus and lung: Secondary | ICD-10-CM

## 2018-02-25 LAB — CBC WITH DIFFERENTIAL/PLATELET
ABS IMMATURE GRANULOCYTES: 0 10*3/uL (ref 0.0–0.1)
BASOS ABS: 0 10*3/uL (ref 0.0–0.1)
BASOS PCT: 0 %
EOS PCT: 0 %
Eosinophils Absolute: 0 10*3/uL (ref 0.0–0.7)
HCT: 44.3 % (ref 36.0–46.0)
Hemoglobin: 14.9 g/dL (ref 12.0–15.0)
Immature Granulocytes: 0 %
LYMPHS PCT: 14 %
Lymphs Abs: 1.5 10*3/uL (ref 0.7–4.0)
MCH: 28.8 pg (ref 26.0–34.0)
MCHC: 33.6 g/dL (ref 30.0–36.0)
MCV: 85.5 fL (ref 78.0–100.0)
MONO ABS: 0.7 10*3/uL (ref 0.1–1.0)
Monocytes Relative: 6 %
NEUTROS ABS: 8.2 10*3/uL — AB (ref 1.7–7.7)
Neutrophils Relative %: 80 %
PLATELETS: 298 10*3/uL (ref 150–400)
RBC: 5.18 MIL/uL — AB (ref 3.87–5.11)
RDW: 12 % (ref 11.5–15.5)
WBC: 10.4 10*3/uL (ref 4.0–10.5)

## 2018-02-25 LAB — CREATININE, SERUM
Creatinine, Ser: 0.69 mg/dL (ref 0.44–1.00)
GFR calc Af Amer: >60 mL/min (ref >60)
GFR calc non Af Amer: >60 mL/min (ref >60)

## 2018-02-25 LAB — CBC
HEMATOCRIT: 41.7 % (ref 36.0–46.0)
HEMOGLOBIN: 14.2 g/dL (ref 12.0–15.0)
MCH: 29.1 pg (ref 26.0–34.0)
MCHC: 34.1 g/dL (ref 30.0–36.0)
MCV: 85.5 fL (ref 78.0–100.0)
Platelets: 282 10*3/uL (ref 150–400)
RBC: 4.88 MIL/uL (ref 3.87–5.11)
RDW: 12 % (ref 11.5–15.5)
WBC: 10 10*3/uL (ref 4.0–10.5)

## 2018-02-25 LAB — BASIC METABOLIC PANEL
Anion gap: 11 (ref 5–15)
BUN: 15 mg/dL (ref 8–23)
CALCIUM: 9.5 mg/dL (ref 8.9–10.3)
CO2: 29 mmol/L (ref 22–32)
CREATININE: 0.66 mg/dL (ref 0.44–1.00)
Chloride: 102 mmol/L (ref 98–111)
Glucose, Bld: 121 mg/dL — ABNORMAL HIGH (ref 70–99)
Potassium: 3.3 mmol/L — ABNORMAL LOW (ref 3.5–5.1)
SODIUM: 142 mmol/L (ref 135–145)

## 2018-02-25 LAB — TSH: TSH: 6.594 u[IU]/mL — AB (ref 0.350–4.500)

## 2018-02-25 MED ORDER — KCL IN DEXTROSE-NACL 20-5-0.45 MEQ/L-%-% IV SOLN
INTRAVENOUS | Status: DC
Start: 2018-02-25 — End: 2018-02-28
  Administered 2018-02-25 – 2018-02-28 (×4): via INTRAVENOUS
  Filled 2018-02-25 (×5): qty 1000

## 2018-02-25 MED ORDER — HEPARIN SODIUM (PORCINE) 5000 UNIT/ML IJ SOLN
5000.0000 [IU] | Freq: Three times a day (TID) | INTRAMUSCULAR | Status: DC
  Administered 2018-02-25 – 2018-02-28 (×9): 5000 [IU] via SUBCUTANEOUS
  Filled 2018-02-25 (×9): qty 1

## 2018-02-25 MED ORDER — ONDANSETRON HCL 4 MG/2ML IJ SOLN
4.0000 mg | Freq: Once | INTRAMUSCULAR | Status: AC
  Administered 2018-02-25: 4 mg via INTRAVENOUS
  Filled 2018-02-25: qty 2

## 2018-02-25 MED ORDER — ALBUTEROL SULFATE (2.5 MG/3ML) 0.083% IN NEBU
2.5000 mg | INHALATION_SOLUTION | Freq: Four times a day (QID) | RESPIRATORY_TRACT | Status: DC
  Administered 2018-02-25 – 2018-02-26 (×2): 2.5 mg via RESPIRATORY_TRACT
  Filled 2018-02-25 (×2): qty 3

## 2018-02-25 MED ORDER — IOHEXOL 300 MG/ML  SOLN
75.0000 mL | Freq: Once | INTRAMUSCULAR | Status: AC | PRN
  Administered 2018-02-25: 75 mL via INTRAVENOUS

## 2018-02-25 MED ORDER — KETOROLAC TROMETHAMINE 15 MG/ML IJ SOLN: 15.0000 mg | Freq: Four times a day (QID) | INTRAMUSCULAR | Status: DC | PRN

## 2018-02-25 MED ORDER — HYDROMORPHONE HCL 1 MG/ML IJ SOLN
0.5000 mg | Freq: Once | INTRAMUSCULAR | Status: AC
  Administered 2018-02-25: 0.5 mg via INTRAVENOUS
  Filled 2018-02-25: qty 1

## 2018-02-25 MED ORDER — METHYLPREDNISOLONE SODIUM SUCC 125 MG IJ SOLR
60.0000 mg | Freq: Two times a day (BID) | INTRAMUSCULAR | Status: AC
  Administered 2018-02-25 – 2018-02-27 (×4): 60 mg via INTRAVENOUS
  Filled 2018-02-25 (×4): qty 2

## 2018-02-25 MED ORDER — ONDANSETRON HCL 4 MG PO TABS: 4.0000 mg | ORAL_TABLET | Freq: Four times a day (QID) | ORAL | Status: DC | PRN

## 2018-02-25 MED ORDER — ONDANSETRON HCL 4 MG/2ML IJ SOLN: 4.0000 mg | Freq: Four times a day (QID) | INTRAMUSCULAR | Status: DC | PRN

## 2018-02-25 NOTE — H&P (Signed)
Referring Physician:  Dexter Sauser is an 62 y.o. female.                       Chief Complaint: neck swelling and dysphagia  HPI: 62 year old female with left sided thyroid cancer s/p surgery x 2 has acute onset of worsening and painful swelling. Her symptoms worsened after 24 hour iodine uptake test 4 days ago and symptoms started 3 days ago. The 24 hour thyroid uptake showed lack of iodine avidity along with non-detectable unstimulated thyroglobulin and thyroglobulin antibody on 01/27/2018 raises the possibility of dedifferentiated disease. She was to discuss these findings from [facility] with her ENT doctor here in [facility] for possible neck surgery.  Today she has difficulty swallowing her own saliva due to pain. She denies fever, chills.  Past Medical History:  Diagnosis Date  . Arthritis   . Cancer ([place])    thyroid  . Diabetes mellitus   . Hypertension   . Osteopenia   . Thyroid disease   . Uterine enlargement   . Uterine enlargement       Past Surgical History:  Procedure Laterality Date  . COLONOSCOPY  06/15/2011   Procedure: COLONOSCOPY;  Surgeon: Jeryl Columbia, MD;  Location: [facility] ENDOSCOPY;  Service: Endoscopy;  Laterality: N/A;  . THYROID LOBECTOMY    . TOTAL THYROIDECTOMY  2012    Family History  Problem Relation Age of Onset  . Cancer Father        lung   Social History:  reports that she has never smoked. She has never used smokeless tobacco. She reports that she does not drink alcohol or use drugs.  Allergies: No Known Allergies   (Not in a hospital admission)  Results for orders placed or performed during the hospital encounter of 02/25/18 (from the past 48 hour(s))  CBC with Differential/Platelet     Status: Abnormal   Collection Time: 02/25/18 12:36 PM  Result Value Ref Range   WBC 10.4 4.0 - 10.5 K/uL   RBC 5.18 (H) 3.87 - 5.11 MIL/uL   Hemoglobin 14.9 12.0 - 15.0 g/dL   HCT 44.3 36.0 - 46.0 %   MCV 85.5 78.0 - 100.0 fL   MCH 28.8 26.0 - 34.0 pg   MCHC 33.6 30.0 - 36.0 g/dL   RDW 12.0 11.5 - 15.5 %   Platelets 298 150 - 400 K/uL   Neutrophils Relative % 80 %   Neutro Abs 8.2 (H) 1.7 - 7.7 K/uL   Lymphocytes Relative 14 %   Lymphs Abs 1.5 0.7 - 4.0 K/uL   Monocytes Relative 6 %   Monocytes Absolute 0.7 0.1 - 1.0 K/uL   Eosinophils Relative 0 %   Eosinophils Absolute 0.0 0.0 - 0.7 K/uL   Basophils Relative 0 %   Basophils Absolute 0.0 0.0 - 0.1 K/uL   Immature Granulocytes 0 %   Abs Immature Granulocytes 0.0 0.0 - 0.1 K/uL    Comment: Performed at [facility], [address], [place], [place] [address]  Basic metabolic panel     Status: Abnormal   Collection Time: 02/25/18 12:36 PM  Result Value Ref Range   Sodium 142 135 - 145 mmol/L   Potassium 3.3 (L) 3.5 - 5.1 mmol/L   Chloride 102 98 - 111 mmol/L   CO2 29 22 - 32 mmol/L   Glucose, Bld 121 (H) 70 - 99 mg/dL   BUN 15 8 - 23 mg/dL   Creatinine,  Ser 0.66 0.44 - 1.00 mg/dL   Calcium 9.5 8.9 - 10.3 mg/dL   GFR calc non Af Amer >60 >60 mL/min   GFR calc Af Amer >60 >60 mL/min    Comment: (NOTE) The eGFR has been calculated using the CKD EPI equation. This calculation has not been validated in all clinical situations. eGFR's persistently <60 mL/min signify possible Chronic Kidney Disease.    Anion gap 11 5 - 15    Comment: Performed at [facility] [address] [place], [place] [address]  TSH     Status: Abnormal   Collection Time: 02/25/18 12:36 PM  Result Value Ref Range   TSH 6.594 (H) 0.350 - 4.500 uIU/mL    Comment: Performed by a 3rd Generation assay with a functional sensitivity of <=0.01 uIU/mL. Performed at [facility], [place] [address] [place], [place] [address]    Ct Soft Tissue Neck W Contrast  Result Date: 02/25/2018 CLINICAL DATA:  Left-sided neck swelling over the last 3 days with difficulty swallowing. History of thyroid cancer. EXAM: CT NECK WITH CONTRAST TECHNIQUE: Multidetector CT imaging of the neck was performed using the  standard protocol following the bolus administration of intravenous contrast. CONTRAST:  69m OMNIPAQUE IOHEXOL 300 MG/ML  SOLN COMPARISON:  03/02/2017 FINDINGS: Pharynx and larynx: No mucosal mass is identified. There is mass effect due to a necrotic mass in the left neck as described below, with displacement of the oropharynx and hypopharynx towards the right. There is some fluid accumulated within the piriform sinus on the left. There is mild edema of the parapharyngeal space. Salivary glands: Right submandibular gland is normal. Both parotid glands are normal. The left submandibular gland is displaced by the necrotic nodal mass described below. The left submandibular gland does appear questionably slightly enlarged and I wondered about the possibility of tumor extension into the submandibular gland, but that is not certain. Thyroid: Previous thyroidectomy. Lymph nodes: Enlargement of a nodal mass at level 2 on the left, the area of the previous biopsy in September of last year. This now measures 2.7 cm in diameter and shows central necrosis. Adjacent hyperdense level 2 node superficial to that measures up to 14 mm in diameter and may also be involved by metastatic disease. No necrosis of that node. Hyperdense level 4 nodes on the left also worrisome for metastatic disease. Vascular: Arterial and venous structures are patent. Limited intracranial: Negative Visualized orbits: Negative Mastoids and visualized paranasal sinuses: Clear Skeleton: Ordinary cervical spondylosis. 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 Additional smaller metastatic nodes on the left levels 2 and 4. Displacement of the left submandibular gland with left submandibular enlargement. Some question about the potential for direct invasion of the left submandibular gland. Mass effect related to the large necrotic  lesion. Some edema in the parapharyngeal space as well. Mass-effect upon the hypopharynx and oropharynx with deviation towards the right. It is possible there could be superimposed infectious inflammatory change particularly in the area of necrosis. Electronically Signed   By: MNelson ChimesM.D.   On: 02/25/2018 15:17    Review Of Systems Constitutional: No fever, chills, chronic weight loss. Eyes: No vision change, wears glasses. No discharge or pain. Ears: No hearing loss, No tinnitus. Neck: Left sided swelling. Respiratory: No asthma, COPD, pneumonias. No shortness of breath. No hemoptysis. Cardiovascular: No chest pain, palpitation, leg edema.  Gastrointestinal: No nausea, vomiting, diarrhea, constipation. No GI bleed. No hepatitis. Genitourinary: No dysuria, hematuria, kidney stone. No incontinance. Neurological: No headache, stroke, seizures.  Psychiatry: No psych facility admission for anxiety, depression, suicide. No detox. Skin: No rash. Musculoskeletal: No joint pain, fibromyalgia. No neck pain, back pain. Lymphadenopathy: No lymphadenopathy. Hematology: No anemia or easy bruising.   Blood pressure 116/86, pulse 97, temperature 98.7 F (37.1 C), temperature source Oral, resp. rate (!) 6, SpO2 99 %. There is no height or weight on file to calculate BMI. General appearance: alert, cooperative, appears stated age and mild distress Head: Normocephalic, atraumatic. Eyes: Brown eyes, pink conjunctiva, corneas clear. PERRL, EOM's intact. Neck: No adenopathy, no carotid bruit, no JVD, supple, symmetrical, trachea midline and thyroid not enlarged. Resp: Clear to auscultation bilaterally. Cardio: Mildly tachycardic, Regular rate and rhythm, S1, S2 normal, II/VI systolic murmur, no click, rub or gallop GI: Soft, non-tender; bowel sounds normal; no organomegaly. Extremities: No edema, cyanosis or clubbing. Skin: Warm and dry.  Neurologic: Alert and oriented X 3, normal strength. Normal  coordination.  Assessment/Plan Left sided thyroid cancer Left sided lymph node enlargement and soft tissue swelling. Dysphagia from above Mild hypokalemia  Admit IV fluids- D5 1/2 NS + 20 meq KCl. IV solumedrol ENT consult  Birdie Riddle, MD  02/25/2018, 5:06 PM

## 2018-02-25 NOTE — ED Triage Notes (Signed)
Pt to ER for evaluation of anterior cervical neck pain. Significant thyroid hx. Goiter present. Reports significant pain to neck and pain with swallowing. Airway intact, able to speak in clear sentences.

## 2018-02-25 NOTE — ED Provider Notes (Signed)
[facility] EMERGENCY DEPARTMENT Provider Note   CSN: [id number] Arrival date & time: 02/25/18  7106     History   Chief Complaint Chief Complaint  Patient presents with  . Thyroid Problem    HPI Jamie [name] is a 62 y.o. female.  Patient with history of left sided thyroid cancer status post 2 previous surgeries presents with acute onset of worsening and painful swelling of the left side of the neck.  Patient has recently been undergoing evaluation for recurrent thyroid cancer on the left side.  This is been at [facility].  Patient had a 24-hour iodine uptake test this week.  Her symptoms started 3 days ago after this test was performed.  Results as noted below.  Yesterday she had difficulty swallowing her thyroid medication and today she is unable to swallow.  No breathing difficulties.  No fevers, nausea, or vomiting.  She has been followed in the past by ENT here.  The onset of this condition was acute. The course is worsening. Aggravating factors: none. Alleviating factors: none.  No reports of tremors, palpitations, recent weight change.   24 hr iodine uptake testing: DISCUSSION: Findings of this study were discussed with the patient and her daughter, who served as Optometrist.  The lack of appreciable iodine avidity in the 2.5 cm biopsy-proven thyroid carcinoma in the left upper neck seen on ultrasound indicates that even should some low-level uptake be present in the lesion after therapeutic dose of radioiodine, little effect on the lesion would be expected.   Lack of iodine avidity along with the nondetectable unstimulated thyroglobulin (and thyroglobulin antibody) on 01/27/2018 raises the possibility of dedifferentiated disease.  Because of these issues, I recommended to Ms. Goelz that if she is a suitable candidate, surgery would be the best course of action at this time. After surgery, radioiodine therapy may be considered for treatment of  potential residual microscopic disease. She was reluctant but indicated her understanding and that she would contact Dr. Wilburn Cornelia in [place] to discuss next steps.     Past Medical History:  Diagnosis Date  . Arthritis   . Cancer ([place])    thyroid  . Diabetes mellitus   . Hypertension   . Osteopenia   . Thyroid disease   . Uterine enlargement   . Uterine enlargement     Patient Active Problem List   Diagnosis Date Noted  . Type 2 diabetes mellitus without complications ([place]) 26/94/8546  . Essential hypertension 07/25/2014  . Palpitations 07/25/2014  . Other specified hypothyroidism 07/25/2014  . Prediabetes 07/25/2014  . Type 2 diabetes mellitus without complication ([place]) 27/09/5007  . Type 2 diabetes mellitus with hyperglycemia ([place]) 01/22/2014  . 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 Back pain 10/22/2013  . Unspecified hypothyroidism 10/22/2013  . HTN (hypertension) 10/22/2013  . Hypovitaminosis D 10/22/2013  . Thyroid cancer ([place]) 03/08/2011  . GERD 10/06/2010  . CALLUSES, FEET, BILATERAL 07/23/2010  . COLONIC POLYPS, HX OF 04/24/2010  . UTI 03/05/2010  . DIABETES MELLITUS, MILD 12/20/2008  . BLOOD IN STOOL 12/20/2008  . HYPERTENSION 07/27/2003  . BENIGN NEOPLASM OF THYROID GLANDS 11/29/2001    Past Surgical History:  Procedure Laterality Date  . COLONOSCOPY  06/15/2011   Procedure: COLONOSCOPY;  Surgeon: Jeryl Columbia, MD;  Location: [facility] ENDOSCOPY;  Service: Endoscopy;  Laterality: N/A;  . THYROID LOBECTOMY    . TOTAL THYROIDECTOMY  2012     OB History   None  Home Medications    Prior to Admission medications   Medication Sig Start Date End Date Taking? Authorizing Provider  acetaminophen (TYLENOL) 500 MG tablet Take 500 mg by mouth every 6 (six) hours as needed.    [provider]  allopurinol (ZYLOPRIM) 300 MG tablet Take 1 tablet (300 mg total) by mouth daily. 07/25/14   Tresa Garter, MD  amLODipine (NORVASC) 5  MG tablet Take 5 mg by mouth daily.    [provider]  amoxicillin (AMOXIL) 500 MG capsule Take 2 capsules (1,000 mg total) by mouth 2 (two) times daily. 05/26/17   Janne Napoleon, NP  levothyroxine (SYNTHROID, LEVOTHROID) 150 MCG tablet Take 1 tablet (150 mcg total) by mouth daily. 02/07/15   Tresa Garter, MD  traMADol (ULTRAM) 50 MG tablet Take 1 tablet (50 mg total) by mouth every 6 (six) hours as needed. 08/22/13   Reyne Dumas, MD  triamterene-hydrochlorothiazide (MAXZIDE-25) 37.5-25 MG tablet TAKE 1 TABLET BY MOUTH DAILY. 02/26/16   Tresa Garter, MD  lisinopril (PRINIVIL,ZESTRIL) 5 MG tablet Take 5 mg by mouth daily.    11/14/12  [provider]    Family History Family History  Problem Relation Age of Onset  . Cancer Father        lung    Social History Social History   Tobacco Use  . Smoking status: Never Smoker  . Smokeless tobacco: Never Used  Substance Use Topics  . Alcohol use: No  . Drug use: No     Allergies   Patient has no known allergies.   Review of Systems Review of Systems  Constitutional: Negative for fever.  HENT: Positive for trouble swallowing. Negative for rhinorrhea, sore throat and voice change.   Eyes: Negative for redness.  Respiratory: Negative for cough.   Cardiovascular: Negative for chest pain and palpitations.  Gastrointestinal: Negative for abdominal pain, diarrhea, nausea and vomiting.  Genitourinary: Negative for dysuria.  Musculoskeletal: Positive for neck pain. Negative for myalgias.  Skin: Negative for rash.  Neurological: Negative for tremors and headaches.     Physical Exam Updated Vital Signs BP (!) 126/93 (BP Location: Right Arm)   Pulse (!) 103   Temp 98.7 F (37.1 C) (Oral)   Resp 17   SpO2 97%   Physical Exam  Constitutional: She appears well-developed and well-nourished.  HENT:  Head: Normocephalic and atraumatic.  Mouth/Throat: Oropharynx is clear and moist.  Eyes: Conjunctivae are  normal. Right eye exhibits no discharge. Left eye exhibits no discharge.  Neck: Normal range of motion. Neck supple. Thyroid mass present.  Patient with moderate tender swelling to the left anterior neck.  Normal intraoral exam.  Patient with good active range of motion of the neck with some discomfort.  She is spitting out her secretions into a vomit bag prior to intraoral exam.  Cardiovascular: Normal rate, regular rhythm and normal heart sounds.  Pulmonary/Chest: Effort normal and breath sounds normal. No respiratory distress. She has no wheezes. She has no rales.  Abdominal: Soft. There is no tenderness. There is no rebound and no guarding.  Neurological: She is alert.  Skin: Skin is warm and dry.  Psychiatric: She has a normal mood and affect.  Nursing note and vitals reviewed.    ED Treatments / Results  Labs (all labs ordered are listed, but only abnormal results are displayed) Labs Reviewed  CBC WITH DIFFERENTIAL/PLATELET - Abnormal; Notable for the following components:      Result Value   RBC 5.18 (*)  Neutro Abs 8.2 (*)    All other components within normal limits  BASIC METABOLIC PANEL - Abnormal; Notable for the following components:   Potassium 3.3 (*)    Glucose, Bld 121 (*)    All other components within normal limits  TSH - Abnormal; Notable for the following components:   TSH 6.594 (*)    All other components within normal limits    EKG None  Radiology Ct Soft Tissue Neck W Contrast  Result Date: 02/25/2018 CLINICAL DATA:  Left-sided neck swelling over the last 3 days with difficulty swallowing. History of thyroid cancer. EXAM: CT NECK WITH CONTRAST TECHNIQUE: Multidetector CT imaging of the neck was performed using the standard protocol following the bolus administration of intravenous contrast. CONTRAST:  81mL OMNIPAQUE IOHEXOL 300 MG/ML  SOLN COMPARISON:  03/02/2017 FINDINGS: Pharynx and larynx: No mucosal mass is identified. There is mass effect due to a  necrotic mass in the left neck as described below, with displacement of the oropharynx and hypopharynx towards the right. There is some fluid accumulated within the piriform sinus on the left. There is mild edema of the parapharyngeal space. Salivary glands: Right submandibular gland is normal. Both parotid glands are normal. The left submandibular gland is displaced by the necrotic nodal mass described below. The left submandibular gland does appear questionably slightly enlarged and I wondered about the possibility of tumor extension into the submandibular gland, but that is not certain. Thyroid: Previous thyroidectomy. Lymph nodes: Enlargement of a nodal mass at level 2 on the left, the area of the previous biopsy in September of last year. This now measures 2.7 cm in diameter and shows central necrosis. Adjacent hyperdense level 2 node superficial to that measures up to 14 mm in diameter and may also be involved by metastatic disease. No necrosis of that node. Hyperdense level 4 nodes on the left also worrisome for metastatic disease. Vascular: Arterial and venous structures are patent. Limited intracranial: Negative Visualized orbits: Negative Mastoids and visualized paranasal sinuses: Clear Skeleton: Ordinary cervical spondylosis. 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 Additional smaller metastatic nodes on the left levels 2 and 4. Displacement of the left submandibular gland with left submandibular enlargement. Some question about the potential for direct invasion of the left submandibular gland. Mass effect related to the large necrotic lesion. Some edema in the parapharyngeal space as well. Mass-effect upon the hypopharynx and oropharynx with deviation towards the right. It is possible there could be superimposed infectious inflammatory change particularly in the area of necrosis.  Electronically Signed   By: Nelson Chimes M.D.   On: 02/25/2018 15:17    Procedures Procedures (including critical care time)  Medications Ordered in ED Medications  HYDROmorphone (DILAUDID) injection 0.5 mg (0.5 mg Intravenous Given 02/25/18 1340)  ondansetron (ZOFRAN) injection 4 mg (4 mg Intravenous Given 02/25/18 1341)  iohexol (OMNIPAQUE) 300 MG/ML solution 75 mL (75 mLs Intravenous Contrast Given 02/25/18 1442)     Initial Impression / Assessment and Plan / ED Course  I have reviewed the triage vital signs and the nursing notes.  Pertinent labs & imaging results that were available during my care of the patient were reviewed by me and considered in my medical decision making (see chart for details).     Patient seen and examined. Work-up initiated. Medications ordered.   Vital signs reviewed and are as follows: BP Marland Kitchen)  126/93 (BP Location: Right Arm)   Pulse (!) 103   Temp 98.7 F (37.1 C) (Oral)   Resp 17   SpO2 97%   Given acute worsening of swelling and inability to swallow, imaging ordered to evaluate location and extent of swelling.  Labs pending.  4:22 PM CT shows extension of known thyroid cancer with swollen lymphadenopathy and mass-effect due to the primary tumor.  Cannot rule out associated infection.  Spoke with Dr. Erik Obey who will consult on patient.    I have spoken with the patient's PCP, Dr. Doylene Canard, who will see patient for medical admission.  Patient and daughter updated on plan and they agree.  Final Clinical Impressions(s) / ED Diagnoses   Final diagnoses:  Thyroid cancer ([place])  Dysphagia, unspecified type   Patient with inability to swallow due to mass-effect from thyroid cancer progression.  Admit for observation, ENT consultation for acutely worsening symptoms.  At current point in time, there are no signs of airway compromise.  ED Discharge Orders    None       Carlisle Cater, Hershal Coria 02/25/18 1624    Virgel Manifold, MD 02/26/18 [phone]

## 2018-02-25 NOTE — Progress Notes (Signed)
Jamie [name] is a 62 y.o. female patient admitted from ED awake, alert - oriented  X 4 - no acute distress noted.  VSS - Blood pressure 116/77, pulse 97, temperature 98.8 F (37.1 C), temperature source Oral, resp. rate 16, weight 50.6 kg (111 lb 8.8 oz), SpO2 98 %.    IV in place, occlusive dsg intact without redness.  Orientation to room, and floor completed with information packet given to patient/family.  Patient declined safety video at this time.  Admission INP armband ID verified with patient/family, and in place.  SR up x 2, fall assessment complete, with patient and family able to verbalize understanding of risk associated with falls, and verbalized understanding to call nsg before up out of bed.  Call light within reach, patient able to voice, and demonstrate understanding.  Skin, clean-dry- intact without evidence of bruising, or skin tears.   No evidence of skin break down noted on exam.   Will cont to eval and treat per MD orders.  Richardean Chimera, RN 02/25/2018 6:36 PM

## 2018-02-26 LAB — BASIC METABOLIC PANEL
Anion gap: 9 (ref 5–15)
BUN: 18 mg/dL (ref 8–23)
CHLORIDE: 104 mmol/L (ref 98–111)
CO2: 26 mmol/L (ref 22–32)
CREATININE: 0.62 mg/dL (ref 0.44–1.00)
Calcium: 9.2 mg/dL (ref 8.9–10.3)
GFR calc Af Amer: >60 mL/min (ref >60)
GFR calc non Af Amer: >60 mL/min (ref >60)
Glucose, Bld: 226 mg/dL — ABNORMAL HIGH (ref 70–99)
Potassium: 3.9 mmol/L (ref 3.5–5.1)
SODIUM: 139 mmol/L (ref 135–145)

## 2018-02-26 LAB — MRSA PCR SCREENING: MRSA BY PCR: NEGATIVE

## 2018-02-26 LAB — CBC
HCT: 39.6 % (ref 36.0–46.0)
Hemoglobin: 13.3 g/dL (ref 12.0–15.0)
MCH: 29.2 pg (ref 26.0–34.0)
MCHC: 33.6 g/dL (ref 30.0–36.0)
MCV: 87 fL (ref 78.0–100.0)
PLATELETS: 287 10*3/uL (ref 150–400)
RBC: 4.55 MIL/uL (ref 3.87–5.11)
RDW: 12.2 % (ref 11.5–15.5)
WBC: 7.4 10*3/uL (ref 4.0–10.5)

## 2018-02-26 LAB — GLUCOSE, CAPILLARY
GLUCOSE-CAPILLARY: 178 mg/dL — AB (ref 70–99)
Glucose-Capillary: 234 mg/dL — ABNORMAL HIGH (ref 70–99)
Glucose-Capillary: 199 mg/dL — ABNORMAL HIGH (ref 70–99)

## 2018-02-26 LAB — HIV ANTIBODY (ROUTINE TESTING W REFLEX): HIV Screen 4th Generation wRfx: NONREACTIVE

## 2018-02-26 MED ORDER — ALBUTEROL SULFATE (2.5 MG/3ML) 0.083% IN NEBU: 2.5000 mg | INHALATION_SOLUTION | RESPIRATORY_TRACT | Status: DC | PRN

## 2018-02-26 MED ORDER — ALLOPURINOL 100 MG PO TABS
100.0000 mg | ORAL_TABLET | Freq: Every day | ORAL | Status: DC
  Administered 2018-02-26 – 2018-02-28 (×3): 100 mg via ORAL
  Filled 2018-02-26 (×3): qty 1

## 2018-02-26 MED ORDER — AMLODIPINE BESYLATE 5 MG PO TABS
5.0000 mg | ORAL_TABLET | Freq: Every day | ORAL | Status: DC
  Administered 2018-02-26 – 2018-02-28 (×3): 5 mg via ORAL
  Filled 2018-02-26 (×3): qty 1

## 2018-02-26 MED ORDER — INSULIN ASPART 100 UNIT/ML ~~LOC~~ SOLN
0.0000 [IU] | Freq: Three times a day (TID) | SUBCUTANEOUS | Status: DC
  Administered 2018-02-26: 2 [IU] via SUBCUTANEOUS
  Administered 2018-02-26: 3 [IU] via SUBCUTANEOUS
  Administered 2018-02-27: 1 [IU] via SUBCUTANEOUS
  Administered 2018-02-27: 2 [IU] via SUBCUTANEOUS
  Administered 2018-02-27: 3 [IU] via SUBCUTANEOUS

## 2018-02-26 MED ORDER — LEVOTHYROXINE SODIUM 100 MCG PO TABS
200.0000 ug | ORAL_TABLET | Freq: Every day | ORAL | Status: DC
  Administered 2018-02-27 – 2018-02-28 (×2): 200 ug via ORAL
  Filled 2018-02-26 (×2): qty 2

## 2018-02-26 MED ORDER — METOPROLOL TARTRATE 25 MG PO TABS
25.0000 mg | ORAL_TABLET | Freq: Two times a day (BID) | ORAL | Status: DC
  Administered 2018-02-26 – 2018-02-28 (×4): 25 mg via ORAL
  Filled 2018-02-26 (×5): qty 1

## 2018-02-26 MED ORDER — ACETAMINOPHEN 500 MG PO TABS: 500.0000 mg | ORAL_TABLET | Freq: Four times a day (QID) | ORAL | Status: DC | PRN

## 2018-02-26 MED ORDER — LEVOTHYROXINE SODIUM 75 MCG PO TABS: 150.0000 ug | ORAL_TABLET | Freq: Every day | ORAL | Status: DC

## 2018-02-26 NOTE — Progress Notes (Signed)
Ref: Jamie Dials, MD   Subjective:  Able to talk and swallow better than yesterday. Neck swlling and tenderness persist. T max 99 degree F.  Objective:  Vital Signs in the last 24 hours: Temp:  [97.7 F (36.5 C)-99 F (37.2 C)] 97.7 F (36.5 C) (08/04 0530) Pulse Rate:  [73-98] 73 (08/04 0530) Cardiac Rhythm: Normal sinus rhythm (08/04 0700) Resp:  [6-16] 16 (08/04 0530) BP: (99-116)/(74-86) 99/74 (08/04 0530) SpO2:  [97 %-99 %] 97 % (08/04 0756) Weight:  [50.6 kg (111 lb 8.8 oz)] 50.6 kg (111 lb 8.8 oz) (08/03 1759)  Physical Exam: BP Readings from Last 1 Encounters:  02/26/18 99/74     Wt Readings from Last 1 Encounters:  02/25/18 50.6 kg (111 lb 8.8 oz)    Weight change:  Body mass index is 22.53 kg/m. HEENT: [place]/AT, Eyes-Brown, Conjunctiva-Pink, Sclera-Non-icteric Neck: No JVD, No bruit, Trachea midline. Left sided swelling from left angle of jaw to lower neck-line Lungs:  Clear, Bilateral. Cardiac:  Regular rhythm, normal S1 and S2, no S3. II/VI systolic murmur. Abdomen:  Soft, non-tender. BS present. Extremities:  No edema present. No cyanosis. No clubbing. CNS: AxOx3, Cranial nerves grossly intact, moves all 4 extremities.  Skin: Warm and dry.   Intake/Output from previous day: 08/03 0701 - 08/04 0700 In: 38.8 [I.V.:38.8] Out: -     Lab Results: BMET    Component Value Date/Time   NA 139 02/26/2018 0451   NA 142 02/25/2018 1236   NA 142 07/25/2014 1148   K 3.9 02/26/2018 0451   K 3.3 (L) 02/25/2018 1236   K 4.5 07/25/2014 1148   CL 104 02/26/2018 0451   CL 102 02/25/2018 1236   CL 101 07/25/2014 1148   CO2 26 02/26/2018 0451   CO2 29 02/25/2018 1236   CO2 32 07/25/2014 1148   GLUCOSE 226 (H) 02/26/2018 0451   GLUCOSE 121 (H) 02/25/2018 1236   GLUCOSE 98 07/25/2014 1148   BUN 18 02/26/2018 0451   BUN 15 02/25/2018 1236   BUN 16 07/25/2014 1148   CREATININE 0.62 02/26/2018 0451   CREATININE 0.69 02/25/2018 1807   CREATININE 0.66 02/25/2018  1236   CREATININE 0.78 07/25/2014 1148   CREATININE 0.73 01/22/2014 1200   CREATININE 0.60 08/22/2013 1048   CALCIUM 9.2 02/26/2018 0451   CALCIUM 9.5 02/25/2018 1236   CALCIUM 9.9 07/25/2014 1148   GFRNONAA >60 02/26/2018 0451   GFRNONAA >60 02/25/2018 1807   GFRNONAA >60 02/25/2018 1236   GFRNONAA 84 07/25/2014 1148   GFRNONAA >89 08/22/2013 1048   GFRAA >60 02/26/2018 0451   GFRAA >60 02/25/2018 1807   GFRAA >60 02/25/2018 1236   GFRAA >89 07/25/2014 1148   GFRAA >89 08/22/2013 1048   CBC    Component Value Date/Time   WBC 7.4 02/26/2018 0451   RBC 4.55 02/26/2018 0451   HGB 13.3 02/26/2018 0451   HCT 39.6 02/26/2018 0451   PLT 287 02/26/2018 0451   MCV 87.0 02/26/2018 0451   MCH 29.2 02/26/2018 0451   MCHC 33.6 02/26/2018 0451   RDW 12.2 02/26/2018 0451   LYMPHSABS 1.5 02/25/2018 1236   MONOABS 0.7 02/25/2018 1236   EOSABS 0.0 02/25/2018 1236   BASOSABS 0.0 02/25/2018 1236   HEPATIC Function Panel No results for input(s): PROT in the last 8760 hours.  Invalid input(s):  ALBUMIN,  AST,  ALT,  ALKPHOS,  BILIDIR,  IBILI HEMOGLOBIN A1C No components found for: HGA1C,  MPG CARDIAC ENZYMES No results found for:  CKTOTAL, CKMB, CKMBINDEX, TROPONINI BNP No results for input(s): PROBNP in the last 8760 hours. TSH Recent Labs    02/25/18 1236  TSH 6.594*   CHOLESTEROL No results for input(s): CHOL in the last 8760 hours.  Scheduled Meds: . allopurinol  100 mg Oral Daily  . amLODipine  5 mg Oral Daily  . heparin  5,000 Units Subcutaneous Q8H  . insulin aspart  0-9 Units Subcutaneous TID WC  . [START ON 02/27/2018] levothyroxine  150 mcg Oral QAC breakfast  . methylPREDNISolone (SOLU-MEDROL) injection  60 mg Intravenous Q12H  . metoprolol tartrate  25 mg Oral BID   Continuous Infusions: . dextrose 5 % and 0.45 % NaCl with KCl 20 mEq/L 100 mL/hr at 02/26/18 0340   PRN Meds:.acetaminophen, albuterol, ketorolac, ondansetron **OR** ondansetron (ZOFRAN)  IV  Assessment/Plan: Left sided thyroid cancer Left sided lymph node enlargement and soft tisse swelling Dysphagia- improving Hypokalemia-resolved  Start clear liquid diet. Awaiting ENT consult   LOS: 1 day    Jamie Dials  MD  02/26/2018, 11:25 AM

## 2018-02-26 NOTE — Consult Note (Signed)
Jamie [name], Jamie [name] 62 y.o., female [id number]     Chief Complaint: recurrent papillary carcinoma thyroid  HPI: 62 yo [place] female underwent thyroidectomy for papillary carcinoma per Dr. Rush Farmer maybe 6 yrs ago.  Followed with neck dissection with residual disease attached to carotid on LEFT.  I 131 therapy.  Had a lump arise last year, FNA positive for papillary.  Declined surgery.  Seen at [facility] with no indication for I131.  Gradual growth of lump.  Thyrogen scan last week negative.  Yesterday, developed increasing swelling and painful swallowing.  CT shows larger mass with necrotic center, possibly affixed to LEFT submandibular gland.  rec'd pred.  Today better.  TSH 6.5 on baseline dose of 150 mcg synthroid.  Admitted for odynophagia.  Prior care per Dr. Wilburn Cornelia.     PMH: Past Medical History:  Diagnosis Date  . Arthritis   . Cancer ([place])    thyroid  . Diabetes mellitus   . Hypertension   . Osteopenia   . Thyroid disease   . Uterine enlargement   . Uterine enlargement     Surg Hx: Past Surgical History:  Procedure Laterality Date  . COLONOSCOPY  06/15/2011   Procedure: COLONOSCOPY;  Surgeon: Jeryl Columbia, MD;  Location: [facility] ENDOSCOPY;  Service: Endoscopy;  Laterality: N/A;  . THYROID LOBECTOMY    . TOTAL THYROIDECTOMY  2012    FHx:   Family History  Problem Relation Age of Onset  . Cancer Father        lung   SocHx:  reports that she has never smoked. She has never used smokeless tobacco. She reports that she does not drink alcohol or use drugs.  ALLERGIES: No Known Allergies  Medications Prior to Admission  Medication Sig Dispense Refill  . acetaminophen (TYLENOL) 500 MG tablet Take 500 mg by mouth every 6 (six) hours as needed.    Marland Kitchen allopurinol (ZYLOPRIM) 100 MG tablet Take 100 mg by mouth daily.    Marland Kitchen amLODipine (NORVASC) 5 MG tablet Take 5 mg by mouth daily.    Marland Kitchen ibuprofen (ADVIL,MOTRIN) 200 MG tablet Take 800 mg by mouth as needed for moderate pain.    Marland Kitchen levothyroxine  (SYNTHROID, LEVOTHROID) 150 MCG tablet Take 1 tablet (150 mcg total) by mouth daily. 90 tablet 3  . metoprolol tartrate (LOPRESSOR) 25 MG tablet Take 25 mg by mouth 2 (two) times daily.  6    Results for orders placed or performed during the hospital encounter of 02/25/18 (from the past 48 hour(s))  CBC with Differential/Platelet     Status: Abnormal   Collection Time: 02/25/18 12:36 PM  Result Value Ref Range   WBC 10.4 4.0 - 10.5 K/uL   RBC 5.18 (H) 3.87 - 5.11 MIL/uL   Hemoglobin 14.9 12.0 - 15.0 g/dL   HCT 44.3 36.0 - 46.0 %   MCV 85.5 78.0 - 100.0 fL   MCH 28.8 26.0 - 34.0 pg   MCHC 33.6 30.0 - 36.0 g/dL   RDW 12.0 11.5 - 15.5 %   Platelets 298 150 - 400 K/uL   Neutrophils Relative % 80 %   Neutro Abs 8.2 (H) 1.7 - 7.7 K/uL   Lymphocytes Relative 14 %   Lymphs Abs 1.5 0.7 - 4.0 K/uL   Monocytes Relative 6 %   Monocytes Absolute 0.7 0.1 - 1.0 K/uL   Eosinophils Relative 0 %   Eosinophils Absolute 0.0 0.0 - 0.7 K/uL   Basophils Relative 0 %   Basophils Absolute 0.0 0.0 -  0.1 K/uL   Immature Granulocytes 0 %   Abs Immature Granulocytes 0.0 0.0 - 0.1 K/uL    Comment: Performed at [facility], [place] [address], [place], [place] [address]  Basic metabolic panel     Status: Abnormal   Collection Time: 02/25/18 12:36 PM  Result Value Ref Range   Sodium 142 135 - 145 mmol/L   Potassium 3.3 (L) 3.5 - 5.1 mmol/L   Chloride 102 98 - 111 mmol/L   CO2 29 22 - 32 mmol/L   Glucose, Bld 121 (H) 70 - 99 mg/dL   BUN 15 8 - 23 mg/dL   Creatinine, Ser 0.66 0.44 - 1.00 mg/dL   Calcium 9.5 8.9 - 10.3 mg/dL   GFR calc non Af Amer >60 >60 mL/min   GFR calc Af Amer >60 >60 mL/min    Comment: (NOTE) The eGFR has been calculated using the CKD EPI equation. This calculation has not been validated in all clinical situations. eGFR's persistently <60 mL/min signify possible Chronic Kidney Disease.    Anion gap 11 5 - 15    Comment: Performed at [facility] [place] [address]  [place], [place] [address]  TSH     Status: Abnormal   Collection Time: 02/25/18 12:36 PM  Result Value Ref Range   TSH 6.594 (H) 0.350 - 4.500 uIU/mL    Comment: Performed by a 3rd Generation assay with a functional sensitivity of <=0.01 uIU/mL. Performed at [facility], [place] [address], [place], [place] [address]   HIV antibody (Routine Testing)     Status: None   Collection Time: 02/25/18  6:07 PM  Result Value Ref Range   HIV Screen 4th Generation wRfx Non Reactive Non Reactive    Comment: (NOTE) Performed At: [facility] [place], [place] [id number] Rush Farmer MD ID:[id number]   CBC     Status: None   Collection Time: 02/25/18  6:07 PM  Result Value Ref Range   WBC 10.0 4.0 - 10.5 K/uL   RBC 4.88 3.87 - 5.11 MIL/uL   Hemoglobin 14.2 12.0 - 15.0 g/dL   HCT 41.7 36.0 - 46.0 %   MCV 85.5 78.0 - 100.0 fL   MCH 29.1 26.0 - 34.0 pg   MCHC 34.1 30.0 - 36.0 g/dL   RDW 12.0 11.5 - 15.5 %   Platelets 282 150 - 400 K/uL    Comment: Performed at [facility], [place] [address], [place], [place] [address]  Creatinine, serum     Status: None   Collection Time: 02/25/18  6:07 PM  Result Value Ref Range   Creatinine, Ser 0.69 0.44 - 1.00 mg/dL   GFR calc non Af Amer >60 >60 mL/min   GFR calc Af Amer >60 >60 mL/min    Comment: (NOTE) The eGFR has been calculated using the CKD EPI equation. This calculation has not been validated in all clinical situations. eGFR's persistently <60 mL/min signify possible Chronic Kidney Disease. Performed at [facility], [place] [address], [place], [place] [address]   Basic metabolic panel     Status: Abnormal   Collection Time: 02/26/18  4:51 AM  Result Value Ref Range   Sodium 139 135 - 145 mmol/L   Potassium 3.9 3.5 - 5.1 mmol/L   Chloride 104 98 - 111 mmol/L   CO2 26 22 - 32 mmol/L   Glucose, Bld 226 (H) 70 - 99 mg/dL   BUN 18 8 -  23 mg/dL   Creatinine, Ser 0.62 0.44 - 1.00 mg/dL   Calcium 9.2 8.9 - 10.3  mg/dL   GFR calc non Af Amer >60 >60 mL/min   GFR calc Af Amer >60 >60 mL/min    Comment: (NOTE) The eGFR has been calculated using the CKD EPI equation. This calculation has not been validated in all clinical situations. eGFR's persistently <60 mL/min signify possible Chronic Kidney Disease.    Anion gap 9 5 - 15    Comment: Performed at [place] [address] [place] [address]  CBC     Status: None   Collection Time: 02/26/18  4:51 AM  Result Value Ref Range   WBC 7.4 4.0 - 10.5 K/uL   RBC 4.55 3.87 - 5.11 MIL/uL   Hemoglobin 13.3 12.0 - 15.0 g/dL   HCT 39.6 36.0 - 46.0 %   MCV 87.0 78.0 - 100.0 fL   MCH 29.2 26.0 - 34.0 pg   MCHC 33.6 30.0 - 36.0 g/dL   RDW 12.2 11.5 - 15.5 %   Platelets 287 150 - 400 K/uL    Comment: Performed at [facility], [place] [address] [place], [place] [address]  MRSA PCR Screening     Status: None   Collection Time: 02/26/18  5:40 AM  Result Value Ref Range   MRSA by PCR NEGATIVE NEGATIVE    Comment:        The GeneXpert MRSA Assay (FDA approved for NASAL specimens only), is one component of a comprehensive MRSA colonization surveillance program. It is not intended to diagnose MRSA infection nor to guide or monitor treatment for MRSA infections. Performed at [facility], [place] [address] [place], [place] [address]   Glucose, capillary     Status: Abnormal   Collection Time: 02/26/18 12:06 PM  Result Value Ref Range   Glucose-Capillary 234 (H) 70 - 99 mg/dL   Ct Soft Tissue Neck W Contrast  Result Date: 02/25/2018 CLINICAL DATA:  Left-sided neck swelling over the last 3 days with difficulty swallowing. History of thyroid cancer. EXAM: CT NECK WITH CONTRAST TECHNIQUE: Multidetector CT imaging of the neck was performed using the standard protocol following the bolus administration of intravenous contrast. CONTRAST:  58m OMNIPAQUE IOHEXOL 300 MG/ML  SOLN COMPARISON:  03/02/2017 FINDINGS: Pharynx and larynx: No  mucosal mass is identified. There is mass effect due to a necrotic mass in the left neck as described below, with displacement of the oropharynx and hypopharynx towards the right. There is some fluid accumulated within the piriform sinus on the left. There is mild edema of the parapharyngeal space. Salivary glands: Right submandibular gland is normal. Both parotid glands are normal. The left submandibular gland is displaced by the necrotic nodal mass described below. The left submandibular gland does appear questionably slightly enlarged and I wondered about the possibility of tumor extension into the submandibular gland, but that is not certain. Thyroid: Previous thyroidectomy. Lymph nodes: Enlargement of a nodal mass at level 2 on the left, the area of the previous biopsy in September of last year. This now measures 2.7 cm in diameter and shows central necrosis. Adjacent hyperdense level 2 node superficial to that measures up to 14 mm in diameter and may also be involved by metastatic disease. No necrosis of that node. Hyperdense level 4 nodes on the left also worrisome for metastatic disease. Vascular: Arterial and venous structures are patent. Limited intracranial: Negative Visualized orbits: Negative Mastoids and visualized  paranasal sinuses: Clear Skeleton: Ordinary cervical spondylosis. 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 Additional smaller metastatic nodes on the left levels 2 and 4. Displacement of the left submandibular gland with left submandibular enlargement. Some question about the potential for direct invasion of the left submandibular gland. Mass effect related to the large necrotic lesion. Some edema in the parapharyngeal space as well. Mass-effect upon the hypopharynx and oropharynx with deviation towards the right. It is possible there could be superimposed infectious  inflammatory change particularly in the area of necrosis. Electronically Signed   By: Nelson Chimes M.D.   On: 02/25/2018 15:17      Blood pressure 103/69, pulse 85, temperature 98 F (36.7 C), temperature source Oral, resp. rate 16, weight 50.6 kg (111 lb 8.8 oz), SpO2 96 %.  PHYSICAL EXAM: Overall appearance:  Petite.  Voice soft but phonatory. Head: NCAT. Ears: not examined Nose: not examined Oral Cavity: clear Oral Pharynx/Hypopharynx/Larynx: not examined.  Neuro: grossly intact Neck:  Indistinct fullness LEFT upper neck.    Studies Reviewed:  CT neck    Assessment/Plan Recurrent papillary ca thyroid with LEFT neck mass and mass effect symptoms including odynophagia.  Hypothyroidism.    Will likely need a wide local excision.  I have increased her synthroid.  Dr. Wilburn Cornelia to assume care in AM.  Erik Obey, Ileene Hutchinson 04/03/8068, 2:07 PM

## 2018-02-27 LAB — GLUCOSE, CAPILLARY
GLUCOSE-CAPILLARY: 180 mg/dL — AB (ref 70–99)
GLUCOSE-CAPILLARY: 153 mg/dL — AB (ref 70–99)
GLUCOSE-CAPILLARY: 208 mg/dL — AB (ref 70–99)
Glucose-Capillary: 222 mg/dL — ABNORMAL HIGH (ref 70–99)

## 2018-02-27 NOTE — Progress Notes (Signed)
Ref: Dixie Dials, MD   Subjective:  Feeling better. Awaiting ENT follow up. Afebrile.  Objective:  Vital Signs in the last 24 hours: Temp:  [98 F (36.7 C)-98.2 F (36.8 C)] 98 F (36.7 C) (08/05 0512) Pulse Rate:  [66-85] 66 (08/05 0512) Cardiac Rhythm: Normal sinus rhythm (08/05 0700) Resp:  [16] 16 (08/05 0512) BP: (103-120)/(69-76) 120/76 (08/05 0512) SpO2:  [95 %-96 %] 96 % (08/05 0512) Weight:  [51.6 kg (113 lb 12.1 oz)] 51.6 kg (113 lb 12.1 oz) (08/05 0512)  Physical Exam: BP Readings from Last 1 Encounters:  02/27/18 120/76     Wt Readings from Last 1 Encounters:  02/27/18 51.6 kg (113 lb 12.1 oz)    Weight change: 1 kg (2 lb 3.3 oz) Body mass index is 22.98 kg/m. HEENT: [place]/AT, Eyes-Brown, Conjunctiva-Pink, Sclera-Non-icteric. Mild decrease in left sided swelling from angle of jaw to base of neck. Neck: No JVD, No bruit, Trachea midline. Lungs:  Clear, Bilateral. Cardiac:  Regular rhythm, normal S1 and S2, no S3. II/VI systolic murmur. Abdomen:  Soft, non-tender. BS present. Extremities:  No edema present. No cyanosis. No clubbing. CNS: AxOx3, Cranial nerves grossly intact, moves all 4 extremities.  Skin: Warm and dry.   Intake/Output from previous day: 08/04 0701 - 08/05 0700 In: 240 [P.O.:240] Out: -     Lab Results: BMET    Component Value Date/Time   NA 139 02/26/2018 0451   NA 142 02/25/2018 1236   NA 142 07/25/2014 1148   K 3.9 02/26/2018 0451   K 3.3 (L) 02/25/2018 1236   K 4.5 07/25/2014 1148   CL 104 02/26/2018 0451   CL 102 02/25/2018 1236   CL 101 07/25/2014 1148   CO2 26 02/26/2018 0451   CO2 29 02/25/2018 1236   CO2 32 07/25/2014 1148   GLUCOSE 226 (H) 02/26/2018 0451   GLUCOSE 121 (H) 02/25/2018 1236   GLUCOSE 98 07/25/2014 1148   BUN 18 02/26/2018 0451   BUN 15 02/25/2018 1236   BUN 16 07/25/2014 1148   CREATININE 0.62 02/26/2018 0451   CREATININE 0.69 02/25/2018 1807   CREATININE 0.66 02/25/2018 1236   CREATININE 0.78  07/25/2014 1148   CREATININE 0.73 01/22/2014 1200   CREATININE 0.60 08/22/2013 1048   CALCIUM 9.2 02/26/2018 0451   CALCIUM 9.5 02/25/2018 1236   CALCIUM 9.9 07/25/2014 1148   GFRNONAA >60 02/26/2018 0451   GFRNONAA >60 02/25/2018 1807   GFRNONAA >60 02/25/2018 1236   GFRNONAA 84 07/25/2014 1148   GFRNONAA >89 08/22/2013 1048   GFRAA >60 02/26/2018 0451   GFRAA >60 02/25/2018 1807   GFRAA >60 02/25/2018 1236   GFRAA >89 07/25/2014 1148   GFRAA >89 08/22/2013 1048   CBC    Component Value Date/Time   WBC 7.4 02/26/2018 0451   RBC 4.55 02/26/2018 0451   HGB 13.3 02/26/2018 0451   HCT 39.6 02/26/2018 0451   PLT 287 02/26/2018 0451   MCV 87.0 02/26/2018 0451   MCH 29.2 02/26/2018 0451   MCHC 33.6 02/26/2018 0451   RDW 12.2 02/26/2018 0451   LYMPHSABS 1.5 02/25/2018 1236   MONOABS 0.7 02/25/2018 1236   EOSABS 0.0 02/25/2018 1236   BASOSABS 0.0 02/25/2018 1236   HEPATIC Function Panel No results for input(s): PROT in the last 8760 hours.  Invalid input(s):  ALBUMIN,  AST,  ALT,  ALKPHOS,  BILIDIR,  IBILI HEMOGLOBIN A1C No components found for: HGA1C,  MPG CARDIAC ENZYMES No results found for: CKTOTAL, CKMB, CKMBINDEX,  TROPONINI BNP No results for input(s): PROBNP in the last 8760 hours. TSH Recent Labs    02/25/18 1236  TSH 6.594*   CHOLESTEROL No results for input(s): CHOL in the last 8760 hours.  Scheduled Meds: . allopurinol  100 mg Oral Daily  . amLODipine  5 mg Oral Daily  . heparin  5,000 Units Subcutaneous Q8H  . insulin aspart  0-9 Units Subcutaneous TID WC  . levothyroxine  200 mcg Oral QAC breakfast  . metoprolol tartrate  25 mg Oral BID   Continuous Infusions: . dextrose 5 % and 0.45 % NaCl with KCl 20 mEq/L 50 mL/hr at 02/26/18 1716   PRN Meds:.acetaminophen, albuterol, ketorolac, ondansetron **OR** ondansetron (ZOFRAN) IV  Assessment/Plan: Left sided thyroid cancer Left sided lymph node and soft tissue swelling Odynophagia,  improving  Advance diet. Appreciate ENT consult.   LOS: 2 days    Dixie Dials  MD  02/27/2018, 9:34 AM

## 2018-02-28 ENCOUNTER — Other Ambulatory Visit: Payer: Self-pay

## 2018-02-28 LAB — GLUCOSE, CAPILLARY
GLUCOSE-CAPILLARY: 109 mg/dL — AB (ref 70–99)
Glucose-Capillary: 160 mg/dL — ABNORMAL HIGH (ref 70–99)

## 2018-02-28 MED ORDER — METFORMIN HCL 500 MG PO TABS: 500.0000 mg | ORAL_TABLET | Freq: Two times a day (BID) | ORAL | 1 refills | Status: AC

## 2018-02-28 MED ORDER — PREDNISONE 10 MG PO TABS: 40.0000 mg | ORAL_TABLET | Freq: Every day | ORAL | 0 refills | Status: AC

## 2018-02-28 MED ORDER — METFORMIN HCL 500 MG PO TABS
500.0000 mg | ORAL_TABLET | Freq: Two times a day (BID) | ORAL | Status: DC
  Administered 2018-02-28: 500 mg via ORAL
  Filled 2018-02-28: qty 1

## 2018-02-28 MED ORDER — LEVOTHYROXINE SODIUM 200 MCG PO TABS: 200.0000 ug | ORAL_TABLET | Freq: Every day | ORAL | 1 refills | Status: AC

## 2018-02-28 MED ORDER — PREDNISONE 20 MG PO TABS
20.0000 mg | ORAL_TABLET | Freq: Two times a day (BID) | ORAL | Status: DC
  Administered 2018-02-28: 20 mg via ORAL
  Filled 2018-02-28: qty 1

## 2018-02-28 NOTE — Discharge Summary (Signed)
Physician Discharge Summary  Patient ID: Jamie [name] MRN: [id number] DOB/AGE: 01-Jan-1956 62 y.o.  Admit date: 02/25/2018 Discharge date: 02/28/2018  Admission Diagnoses: Left sided thyroid cancer Left sided lymph node enlargement and soft tissue swelling Dysphagia from above Mild hypokalemia  Discharge Diagnoses:  Principle problem: Left sided papillary thyroid cancer recurrence Active Problems:   Head and neck lymphadenopathy   Odynophagia   Hyperglycemia, medication induced   Hypokalemia, resolved   Hypothyroidism  Discharged Condition: fair  Hospital Course: 62 year old [place] had acute onset of worsening and painful swelling following 24 hour thyroid uptake. CT scan of neck showed larger mass with necrotic center and possible involvement of left submandibular gland. She was treated with IV solumedrol and albuterol breathing treatments.  She had significant relief with shrinkage of the left sided swelling by at least 50 %. She was able to swallow liquids next day and soft diet by 4th. Day. She received Insulin coverage for hyperglycemia. She had ENT consult. She was discharged home in stable condition with follow up by me in 1 week and by ENT doctor in 2-4 weeks.  Consults: ENT  Significant Diagnostic Studies: labs: Normal CBC, Near normal BMET except potassium of 3.3 meq. And blood sugar of 121 mg.  TSH was 6.594 uIU/mL.  CT soft tissue neck showed enlargement and central necrosis of a metastatic lymph node, level 2 on the left. Displacement of left submandibular gland with enlargement of the gland with possible invasion.  There is mass effect on hypopharynx and oropharynx with deviation to the right.  Treatments: Solumedrol, Ketorolac, IV fluids.  Discharge Exam: Blood pressure 111/80, pulse 65, temperature 98.3 F (36.8 C), temperature source Oral, resp. rate 12, weight 53.9 kg (118 lb 13.3 oz), SpO2 95 %. General appearance: alert, cooperative and appears stated age. Head:  Normocephalic, atraumatic. Eyes: Brown eyes, pink conjunctiva, corneas clear. PERRL, EOM's intact.  Neck: No adenopathy, no carotid bruit, no JVD, supple, symmetrical, trachea midline and thyroid not enlarged. Left angle of jaw and left sided neck swelling down by 50 %. Resp: Clear to auscultation bilaterally. Cardio: Regular rate and rhythm, S1, S2 normal, II/VI systolic murmur, no click, rub or gallop. GI: Soft, non-tender; bowel sounds normal; no organomegaly. Extremities: No edema, cyanosis or clubbing. Skin: Warm and dry.  Neurologic: Alert and oriented X 3, normal strength and tone. Normal coordination and gait.  Disposition: Discharge disposition: 01-Home or Self Care        Allergies as of 02/28/2018   No Known Allergies     Medication List    STOP taking these medications   amLODipine 5 MG tablet Commonly known as:  NORVASC   traMADol 50 MG tablet Commonly known as:  ULTRAM   triamterene-hydrochlorothiazide 37.5-25 MG tablet Commonly known as:  MAXZIDE-25     TAKE these medications   acetaminophen 500 MG tablet Commonly known as:  TYLENOL Take 500 mg by mouth every 6 (six) hours as needed.   allopurinol 100 MG tablet Commonly known as:  ZYLOPRIM Take 100 mg by mouth daily. What changed:  Another medication with the same name was removed. Continue taking this medication, and follow the directions you see here.   ibuprofen 200 MG tablet Commonly known as:  ADVIL,MOTRIN Take 800 mg by mouth as needed for moderate pain.   levothyroxine 200 MCG tablet Commonly known as:  SYNTHROID, LEVOTHROID Take 1 tablet (200 mcg total) by mouth daily before breakfast. Start taking on:  03/01/2018 What changed:  medication strength  how much to take  when to take this   metFORMIN 500 MG tablet Commonly known as:  GLUCOPHAGE Take 1 tablet (500 mg total) by mouth 2 (two) times daily with a meal.   metoprolol tartrate 25 MG tablet Commonly known as:  LOPRESSOR Take  25 mg by mouth 2 (two) times daily.   predniSONE 10 MG tablet Commonly known as:  DELTASONE Take 4 tablets (40 mg total) by mouth daily with breakfast for 18 days. For 2 days then 3 tablet daily x 2 days then 2 tablet daily x 2 days then 1 tablet daily x 4 days then 1/2 tablet daily x 8 days      Follow-up Information    Dixie Dials, MD. Schedule an appointment as soon as possible for a visit in 1 week(s).   Specialty:  Cardiology Contact information: [place] [address] [phone]           Signed: Birdie Riddle 02/28/2018, 2:16 PM

## 2018-02-28 NOTE — Progress Notes (Signed)
Jamie [name] to be D/C'd Home per MD order.  Discussed with the patient and all questions fully answered.  VSS, Skin clean, dry and intact without evidence of skin break down, no evidence of skin tears noted. IV catheter discontinued intact. Site without signs and symptoms of complications. Dressing and pressure applied.  An After Visit Summary was printed and given to the patient. Patient received prescription.  D/c education completed with patient/family including follow up instructions, medication list, d/c activities limitations if indicated, with other d/c instructions as indicated by MD - patient able to verbalize understanding, all questions fully answered.   Patient instructed to return to ED, call 911, or call MD for any changes in condition.   Patient escorted to exit via wheelchair and D/C'd home in private auto.  [place] 02/28/2018 2:50 PM

## 2018-02-28 NOTE — Progress Notes (Signed)
[employer] video interpreter used to go over D/C papers with pt.

## 2018-03-06 MED FILL — METOPROLOL TARTRATE 25 MG T: 25 | 30 days supply | Qty: 60 | Fill #2

## 2018-03-15 DIAGNOSIS — C770 Secondary and unspecified malignant neoplasm of lymph nodes of head, face and neck: Secondary | ICD-10-CM | POA: Insufficient documentation

## 2018-03-24 ENCOUNTER — Other Ambulatory Visit: Payer: Self-pay | Admitting: Otolaryngology

## 2018-04-05 MED FILL — AMLODIPINE BESYLATE 5 MG TA: 5 | 30 days supply | Qty: 30 | Fill #4

## 2018-04-11 NOTE — Pre-Procedure Instructions (Signed)
Jeanice Winograd  04/11/2018      [place] ([place]), [place] [address] [place] ([place] [address] Phone: [phone] Fax: [place] [place] [address] Phone: [phone] Fax: [phone]    Your procedure is scheduled on Sept. 25  Report to [facility] Admitting at 9:00 A.M.  Call this number if you have problems the morning of surgery:  [phone]   Remember:  Do not eat or drink after midnight.      Take these medicines the morning of surgery with A SIP OF WATER :              Tylenol if needed              alopurinol (zyloprim)              Amlodipine (norvasc)              Metoprolol (lopressor)                7 days prior to surgery STOP taking any Aspirin(unless otherwise instructed by your surgeon), Aleve, Naproxen, Ibuprofen, Motrin, Advil, Goody's, BC's, all herbal medications, fish oil, and all vitamins                    How to Manage Your Diabetes Before and After Surgery  Why is it important to control my blood sugar before and after surgery? . Improving blood sugar levels before and after surgery helps healing and can limit problems. . A way of improving blood sugar control is eating a healthy diet by: o  Eating less sugar and carbohydrates o  Increasing activity/exercise o  Talking with your doctor about reaching your blood sugar goals . High blood sugars (greater than 180 mg/dL) can raise your risk of infections and slow your recovery, so you will need to focus on controlling your diabetes during the weeks before surgery. . Make sure that the doctor who takes care of your diabetes knows about your planned surgery including the date and location.  How do I manage my blood sugar before surgery? . Check your blood sugar at least 4 times a day, starting 2 days before surgery, to make sure that the level is  not too high or low. o Check your blood sugar the morning of your surgery when you wake up and every 2 hours until you get to the Short Stay unit. . If your blood sugar is less than 70 mg/dL, you will need to treat for low blood sugar: o Do not take insulin. o Treat a low blood sugar (less than 70 mg/dL) with  cup of clear juice (cranberry or apple), 4 glucose tablets, OR glucose gel. Recheck blood sugar in 15 minutes after treatment (to make sure it is greater than 70 mg/dL). If your blood sugar is not greater than 70 mg/dL on recheck, call [phone] o  for further instructions. . Report your blood sugar to the short stay nurse when you get to Short Stay.  . If you are admitted to the hospital after surgery: o Your blood sugar will be checked by the staff and you will probably be given insulin after surgery (instead of oral diabetes medicines) to make sure you have good blood sugar levels. o The goal for blood sugar control after surgery is 80-180 mg/dL.  WHAT DO I DO ABOUT MY DIABETES MEDICATION?   Marland Kitchen Do not take oral diabetes medicines (pills) the morning of surgery.      Do not wear jewelry, make-up or nail polish.  Do not wear lotions, powders, or perfumes, or deodorant.  Do not shave 48 hours prior to surgery.  Men may shave face and neck.  Do not bring valuables to the hospital.  [facility] is not responsible for any belongings or valuables.  Contacts, dentures or bridgework may not be worn into surgery.  Leave your suitcase in the car.  After surgery it may be brought to your room.  For patients admitted to the hospital, discharge time will be determined by your treatment team.  Patients discharged the day of surgery will not be allowed to drive home.    Special instructions:  [place]- Preparing For Surgery  Before surgery, you can play an important role. Because skin is not sterile, your skin needs to be as free of germs as possible. You can reduce the  number of germs on your skin by washing with CHG (chlorahexidine gluconate) Soap before surgery.  CHG is an antiseptic cleaner which kills germs and bonds with the skin to continue killing germs even after washing.    Oral Hygiene is also important to reduce your risk of infection.  Remember - BRUSH YOUR TEETH THE MORNING OF SURGERY WITH YOUR REGULAR TOOTHPASTE  Please do not use if you have an allergy to CHG or antibacterial soaps. If your skin becomes reddened/irritated stop using the CHG.  Do not shave (including legs and underarms) for at least 48 hours prior to first CHG shower. It is OK to shave your face.  Please follow these instructions carefully.   1. Shower the NIGHT BEFORE SURGERY and the MORNING OF SURGERY with CHG.   2. If you chose to wash your hair, wash your hair first as usual with your normal shampoo.  3. After you shampoo, rinse your hair and body thoroughly to remove the shampoo.  4. Use CHG as you would any other liquid soap. You can apply CHG directly to the skin and wash gently with a scrungie or a clean washcloth.   5. Apply the CHG Soap to your body ONLY FROM THE NECK DOWN.  Do not use on open wounds or open sores. Avoid contact with your eyes, ears, mouth and genitals (private parts). Wash Face and genitals (private parts)  with your normal soap.  6. Wash thoroughly, paying special attention to the area where your surgery will be performed.  7. Thoroughly rinse your body with warm water from the neck down.  8. DO NOT shower/wash with your normal soap after using and rinsing off the CHG Soap.  9. Pat yourself dry with a CLEAN TOWEL.  10. Wear CLEAN PAJAMAS to bed the night before surgery, wear comfortable clothes the morning of surgery  11. Place CLEAN SHEETS on your bed the night of your first shower and DO NOT SLEEP WITH PETS.    Day of Surgery:  Do not apply any deodorants/lotions.  Please wear clean clothes to the hospital/surgery center.   Remember  to brush your teeth WITH YOUR REGULAR TOOTHPASTE.    Please read over the following fact sheets that you were given. Coughing and Deep Breathing and Surgical Site Infection Prevention

## 2018-04-12 ENCOUNTER — Other Ambulatory Visit: Payer: Self-pay

## 2018-04-12 ENCOUNTER — Encounter (HOSPITAL_COMMUNITY): Payer: Self-pay

## 2018-04-12 ENCOUNTER — Encounter (HOSPITAL_COMMUNITY)
Admission: RE | Admit: 2018-04-12 | Discharge: 2018-04-12 | Disposition: A | Discharge status: Home / Self Care | Payer: Self-pay | Source: Ambulatory Visit | Attending: Otolaryngology | Admitting: Otolaryngology

## 2018-04-12 DIAGNOSIS — E890 Postprocedural hypothyroidism: Secondary | ICD-10-CM | POA: Insufficient documentation

## 2018-04-12 DIAGNOSIS — C73 Malignant neoplasm of thyroid gland: Secondary | ICD-10-CM | POA: Insufficient documentation

## 2018-04-12 DIAGNOSIS — I10 Essential (primary) hypertension: Secondary | ICD-10-CM | POA: Insufficient documentation

## 2018-04-12 DIAGNOSIS — E119 Type 2 diabetes mellitus without complications: Secondary | ICD-10-CM | POA: Insufficient documentation

## 2018-04-12 DIAGNOSIS — Z01812 Encounter for preprocedural laboratory examination: Secondary | ICD-10-CM | POA: Insufficient documentation

## 2018-04-12 LAB — GLUCOSE, CAPILLARY: Glucose-Capillary: 125 mg/dL — ABNORMAL HIGH (ref 70–99)

## 2018-04-12 LAB — CBC
HCT: 43.8 % (ref 36.0–46.0)
Hemoglobin: 14.5 g/dL (ref 12.0–15.0)
MCH: 29.1 pg (ref 26.0–34.0)
MCHC: 33.1 g/dL (ref 30.0–36.0)
MCV: 87.8 fL (ref 78.0–100.0)
PLATELETS: 287 10*3/uL (ref 150–400)
RBC: 4.99 MIL/uL (ref 3.87–5.11)
RDW: 11.9 % (ref 11.5–15.5)
WBC: 5.7 10*3/uL (ref 4.0–10.5)

## 2018-04-12 LAB — BASIC METABOLIC PANEL
Anion gap: 11 (ref 5–15)
BUN: 12 mg/dL (ref 8–23)
CO2: 23 mmol/L (ref 22–32)
Calcium: 9.5 mg/dL (ref 8.9–10.3)
Chloride: 106 mmol/L (ref 98–111)
Creatinine, Ser: 0.47 mg/dL (ref 0.44–1.00)
GFR calc Af Amer: >60 mL/min (ref >60)
Glucose, Bld: 139 mg/dL — ABNORMAL HIGH (ref 70–99)
POTASSIUM: 3.6 mmol/L (ref 3.5–5.1)
Sodium: 140 mmol/L (ref 135–145)

## 2018-04-12 NOTE — Pre-Procedure Instructions (Addendum)
Rosaland Stamant  04/12/2018      [place] ([place]), [place] [address] [place] ([place]) [place] [address] Phone: [phone] Fax: [place], [place] [address] [place] [address] Phone: [phone] Fax: [phone]    Your procedure is scheduled on Sept. 25  Report to [facility] Admitting at 9:00 A.M.  Call this number if you have problems the morning of surgery:  [phone]   Remember:  Do not eat or drink after midnight.      Take these medicines the morning of surgery with A SIP OF WATER :              Tylenol if needed              alopurinol (zyloprim)              Amlodipine (norvasc)              Metoprolol (lopressor)                7 days prior to surgery STOP taking any Aspirin(unless otherwise instructed by your surgeon), Aleve, Naproxen, Ibuprofen, Motrin, Advil, Goody's, BC's, all herbal medications, fish oil, and all vitamins                    How to Manage Your Diabetes Before and After Surgery  Why is it important to control my blood sugar before and after surgery? . Improving blood sugar levels before and after surgery helps healing and can limit problems. . A way of improving blood sugar control is eating a healthy diet by: o  Eating less sugar and carbohydrates o  Increasing activity/exercise o  Talking with your doctor about reaching your blood sugar goals . High blood sugars (greater than 180 mg/dL) can raise your risk of infections and slow your recovery, so you will need to focus on controlling your diabetes during the weeks before surgery. . Make sure that the doctor who takes care of your diabetes knows about your planned surgery including the date and location.  How do I manage my blood sugar before surgery? . Check your blood sugar at least 4 times a day, starting 2 days before surgery, to make sure that the level is  not too high or low. o Check your blood sugar the morning of your surgery when you wake up and every 2 hours until you get to the Short Stay unit. . If your blood sugar is less than 70 mg/dL, you will need to treat for low blood sugar: o Do not take insulin. o Treat a low blood sugar (less than 70 mg/dL) with  cup of clear juice (cranberry or apple), 4 glucose tablets, OR glucose gel. Recheck blood sugar in 15 minutes after treatment (to make sure it is greater than 70 mg/dL). If your blood sugar is not greater than 70 mg/dL on recheck, call [phone] o  for further instructions. . Report your blood sugar to the short stay nurse when you get to Short Stay.  . If you are admitted to the hospital after surgery: o Your blood sugar will be checked by the staff and you will probably be given insulin after surgery (instead of oral diabetes medicines) to make sure you have good blood sugar levels. o The goal for blood sugar control after surgery is 80-180 mg/dL.  WHAT DO I DO ABOUT MY DIABETES MEDICATION?   Marland Kitchen Do not take oral diabetes medicines (pills) the morning of surgery.      Do not wear jewelry, make-up or nail polish.  Do not wear lotions, powders, or perfumes, or deodorant.  Do not shave 48 hours prior to surgery.  Men may shave face and neck.  Do not bring valuables to the hospital.  [facility] is not responsible for any belongings or valuables.  Contacts, dentures or bridgework may not be worn into surgery.  Leave your suitcase in the car.  After surgery it may be brought to your room.  For patients admitted to the hospital, discharge time will be determined by your treatment team.  Patients discharged the day of surgery will not be allowed to drive home.    Special instructions:  [place]- Preparing For Surgery  Before surgery, you can play an important role. Because skin is not sterile, your skin needs to be as free of germs as possible. You can reduce the  number of germs on your skin by washing with CHG (chlorahexidine gluconate) Soap before surgery.  CHG is an antiseptic cleaner which kills germs and bonds with the skin to continue killing germs even after washing.    Oral Hygiene is also important to reduce your risk of infection.  Remember - BRUSH YOUR TEETH THE MORNING OF SURGERY WITH YOUR REGULAR TOOTHPASTE  Please do not use if you have an allergy to CHG or antibacterial soaps. If your skin becomes reddened/irritated stop using the CHG.  Do not shave (including legs and underarms) for at least 48 hours prior to first CHG shower. It is OK to shave your face.  Please follow these instructions carefully.   1. Shower the NIGHT BEFORE SURGERY and the MORNING OF SURGERY with CHG.   2. If you chose to wash your hair, wash your hair first as usual with your normal shampoo.  3. After you shampoo, rinse your hair and body thoroughly to remove the shampoo.  4. Use CHG as you would any other liquid soap. You can apply CHG directly to the skin and wash gently with a scrungie or a clean washcloth.   5. Apply the CHG Soap to your body ONLY FROM THE NECK DOWN.  Do not use on open wounds or open sores. Avoid contact with your eyes, ears, mouth and genitals (private parts). Wash Face and genitals (private parts)  with your normal soap.  6. Wash thoroughly, paying special attention to the area where your surgery will be performed.  7. Thoroughly rinse your body with warm water from the neck down.  8. DO NOT shower/wash with your normal soap after using and rinsing off the CHG Soap.  9. Pat yourself dry with a CLEAN TOWEL.  10. Wear CLEAN PAJAMAS to bed the night before surgery, wear comfortable clothes the morning of surgery  11. Place CLEAN SHEETS on your bed the night of your first shower and DO NOT SLEEP WITH PETS.    Day of Surgery:  Do not apply any deodorants/lotions.  Please wear clean clothes to the hospital/surgery center.   Remember  to brush your teeth WITH YOUR REGULAR TOOTHPASTE.    Please read over the following fact sheets that you were given. Coughing and Deep Breathing and Surgical Site Infection Prevention

## 2018-04-12 NOTE — Progress Notes (Addendum)
PCP - Dixie Dials  Cardiologist - Dixie Dials  DM - Type II (borderline)  Fasting sugars - 85-92  EKG - 02-25-18 Echo - denies Stress Test - > 1 year ago Cath - denies  Pt denies SOB, cough, fever, chest pain  Pt states understanding of instructions for day of surgery.  Interpreter: Jamie [name] was here for appointment

## 2018-04-13 LAB — HEMOGLOBIN A1C
HEMOGLOBIN A1C: 6.1 % — AB (ref 4.8–5.6)
MEAN PLASMA GLUCOSE: 128 mg/dL

## 2018-04-19 ENCOUNTER — Encounter (HOSPITAL_COMMUNITY)
Admission: RE | Disposition: A | Discharge status: Home / Self Care | Payer: Self-pay | Source: Ambulatory Visit | Attending: Otolaryngology

## 2018-04-19 ENCOUNTER — Observation Stay (HOSPITAL_COMMUNITY): Payer: Self-pay | Admitting: Anesthesiology

## 2018-04-19 ENCOUNTER — Other Ambulatory Visit: Payer: Self-pay

## 2018-04-19 ENCOUNTER — Observation Stay (HOSPITAL_COMMUNITY)
Admission: RE | Admit: 2018-04-19 | Discharge: 2018-04-21 | Disposition: A | Discharge status: Home / Self Care | Payer: Self-pay | Source: Ambulatory Visit | Attending: Otolaryngology | Admitting: Otolaryngology

## 2018-04-19 ENCOUNTER — Encounter (HOSPITAL_COMMUNITY): Payer: Self-pay | Admitting: *Deleted

## 2018-04-19 DIAGNOSIS — J029 Acute pharyngitis, unspecified: Secondary | ICD-10-CM | POA: Insufficient documentation

## 2018-04-19 DIAGNOSIS — M109 Gout, unspecified: Secondary | ICD-10-CM | POA: Insufficient documentation

## 2018-04-19 DIAGNOSIS — C73 Malignant neoplasm of thyroid gland: Principal | ICD-10-CM | POA: Insufficient documentation

## 2018-04-19 DIAGNOSIS — M1990 Unspecified osteoarthritis, unspecified site: Secondary | ICD-10-CM | POA: Insufficient documentation

## 2018-04-19 DIAGNOSIS — Z801 Family history of malignant neoplasm of trachea, bronchus and lung: Secondary | ICD-10-CM | POA: Insufficient documentation

## 2018-04-19 DIAGNOSIS — I10 Essential (primary) hypertension: Secondary | ICD-10-CM | POA: Insufficient documentation

## 2018-04-19 DIAGNOSIS — E119 Type 2 diabetes mellitus without complications: Secondary | ICD-10-CM | POA: Insufficient documentation

## 2018-04-19 DIAGNOSIS — M8580 Other specified disorders of bone density and structure, unspecified site: Secondary | ICD-10-CM | POA: Insufficient documentation

## 2018-04-19 DIAGNOSIS — Z7984 Long term (current) use of oral hypoglycemic drugs: Secondary | ICD-10-CM | POA: Insufficient documentation

## 2018-04-19 DIAGNOSIS — E890 Postprocedural hypothyroidism: Secondary | ICD-10-CM | POA: Insufficient documentation

## 2018-04-19 DIAGNOSIS — R221 Localized swelling, mass and lump, neck: Secondary | ICD-10-CM | POA: Diagnosis present

## 2018-04-19 DIAGNOSIS — C770 Secondary and unspecified malignant neoplasm of lymph nodes of head, face and neck: Secondary | ICD-10-CM | POA: Insufficient documentation

## 2018-04-19 HISTORY — PX: RADICAL NECK DISSECTION: SHX2284

## 2018-04-19 LAB — GLUCOSE, CAPILLARY
GLUCOSE-CAPILLARY: 150 mg/dL — AB (ref 70–99)
Glucose-Capillary: 100 mg/dL — ABNORMAL HIGH (ref 70–99)
Glucose-Capillary: 101 mg/dL — ABNORMAL HIGH (ref 70–99)

## 2018-04-19 SURGERY — DISSECTION, NECK, RADICAL
Anesthesia: General | Site: Neck | Laterality: Left

## 2018-04-19 MED ORDER — CHLORHEXIDINE GLUCONATE CLOTH 2 % EX PADS: 6.0000 | MEDICATED_PAD | Freq: Once | CUTANEOUS | Status: DC

## 2018-04-19 MED ORDER — ONDANSETRON HCL 4 MG/2ML IJ SOLN
INTRAMUSCULAR | Status: DC | PRN
  Administered 2018-04-19: 4 mg via INTRAVENOUS

## 2018-04-19 MED ORDER — ALLOPURINOL 100 MG PO TABS
100.0000 mg | ORAL_TABLET | Freq: Every day | ORAL | Status: DC
  Administered 2018-04-19 – 2018-04-21 (×3): 100 mg via ORAL
  Filled 2018-04-19 (×3): qty 1

## 2018-04-19 MED ORDER — HYDROCODONE-ACETAMINOPHEN 5-325 MG PO TABS
ORAL_TABLET | ORAL | Status: AC
  Administered 2018-04-19: 1 via ORAL
  Filled 2018-04-19: qty 1

## 2018-04-19 MED ORDER — METFORMIN HCL 500 MG PO TABS
500.0000 mg | ORAL_TABLET | Freq: Two times a day (BID) | ORAL | Status: DC
  Administered 2018-04-19 – 2018-04-21 (×4): 500 mg via ORAL
  Filled 2018-04-19 (×4): qty 1

## 2018-04-19 MED ORDER — HYDROCODONE-ACETAMINOPHEN 5-325 MG PO TABS: 1.0000 | ORAL_TABLET | Freq: Four times a day (QID) | ORAL | 0 refills | Status: AC | PRN

## 2018-04-19 MED ORDER — MIDAZOLAM HCL 5 MG/5ML IJ SOLN
INTRAMUSCULAR | Status: DC | PRN
  Administered 2018-04-19: 2 mg via INTRAVENOUS

## 2018-04-19 MED ORDER — FENTANYL CITRATE (PF) 100 MCG/2ML IJ SOLN
INTRAMUSCULAR | Status: DC | PRN
  Administered 2018-04-19 (×3): 50 ug via INTRAVENOUS

## 2018-04-19 MED ORDER — ONDANSETRON HCL 4 MG PO TABS: 4.0000 mg | ORAL_TABLET | ORAL | Status: DC | PRN

## 2018-04-19 MED ORDER — ACETAMINOPHEN 650 MG RE SUPP: 650.0000 mg | RECTAL | Status: DC | PRN

## 2018-04-19 MED ORDER — AMLODIPINE BESYLATE 5 MG PO TABS
5.0000 mg | ORAL_TABLET | Freq: Every day | ORAL | Status: DC
  Administered 2018-04-19 – 2018-04-21 (×3): 5 mg via ORAL
  Filled 2018-04-19 (×3): qty 1

## 2018-04-19 MED ORDER — DEXTROSE IN LACTATED RINGERS 5 % IV SOLN
INTRAVENOUS | Status: DC
  Administered 2018-04-20: 08:00:00 via INTRAVENOUS

## 2018-04-19 MED ORDER — FENTANYL CITRATE (PF) 100 MCG/2ML IJ SOLN
25.0000 ug | INTRAMUSCULAR | Status: DC | PRN
  Administered 2018-04-19: 50 ug via INTRAVENOUS

## 2018-04-19 MED ORDER — ONDANSETRON HCL 4 MG/2ML IJ SOLN
INTRAMUSCULAR | Status: AC
  Filled 2018-04-19: qty 2

## 2018-04-19 MED ORDER — ONDANSETRON HCL 4 MG/2ML IJ SOLN: 4.0000 mg | INTRAMUSCULAR | Status: DC | PRN

## 2018-04-19 MED ORDER — MORPHINE SULFATE (PF) 2 MG/ML IV SOLN: 2.0000 mg | INTRAVENOUS | Status: DC | PRN

## 2018-04-19 MED ORDER — METOPROLOL TARTRATE 25 MG PO TABS
25.0000 mg | ORAL_TABLET | Freq: Two times a day (BID) | ORAL | Status: DC
  Administered 2018-04-19 – 2018-04-21 (×4): 25 mg via ORAL
  Filled 2018-04-19 (×4): qty 1

## 2018-04-19 MED ORDER — 0.9 % SODIUM CHLORIDE (POUR BTL) OPTIME
TOPICAL | Status: DC | PRN
  Administered 2018-04-19: 1000 mL

## 2018-04-19 MED ORDER — LIDOCAINE-EPINEPHRINE 1 %-1:100000 IJ SOLN
INTRAMUSCULAR | Status: DC | PRN
  Administered 2018-04-19: 6 mL

## 2018-04-19 MED ORDER — ACETAMINOPHEN 160 MG/5ML PO SOLN: 650.0000 mg | ORAL | Status: DC | PRN

## 2018-04-19 MED ORDER — MIDAZOLAM HCL 2 MG/2ML IJ SOLN
INTRAMUSCULAR | Status: AC
  Filled 2018-04-19: qty 2

## 2018-04-19 MED ORDER — BACITRACIN ZINC 500 UNIT/GM EX OINT
TOPICAL_OINTMENT | CUTANEOUS | Status: AC
  Filled 2018-04-19: qty 28.35

## 2018-04-19 MED ORDER — HYDROCODONE-ACETAMINOPHEN 5-325 MG PO TABS
1.0000 | ORAL_TABLET | ORAL | Status: DC | PRN
  Administered 2018-04-19 (×2): 1 via ORAL
  Filled 2018-04-19: qty 1

## 2018-04-19 MED ORDER — DEXAMETHASONE SODIUM PHOSPHATE 10 MG/ML IJ SOLN
INTRAMUSCULAR | Status: AC
  Filled 2018-04-19: qty 1

## 2018-04-19 MED ORDER — BACITRACIN ZINC 500 UNIT/GM EX OINT
TOPICAL_OINTMENT | CUTANEOUS | Status: DC | PRN
  Administered 2018-04-19: 1 via TOPICAL

## 2018-04-19 MED ORDER — ONDANSETRON HCL 4 MG/2ML IJ SOLN: 4.0000 mg | Freq: Once | INTRAMUSCULAR | Status: DC | PRN

## 2018-04-19 MED ORDER — LIDOCAINE 2% (20 MG/ML) 5 ML SYRINGE
INTRAMUSCULAR | Status: AC
  Filled 2018-04-19: qty 5

## 2018-04-19 MED ORDER — CEFAZOLIN SODIUM-DEXTROSE 2-4 GM/100ML-% IV SOLN
2.0000 g | INTRAVENOUS | Status: AC
  Administered 2018-04-19: 2 g via INTRAVENOUS
  Filled 2018-04-19: qty 100

## 2018-04-19 MED ORDER — PROPOFOL 10 MG/ML IV BOLUS
INTRAVENOUS | Status: DC | PRN
  Administered 2018-04-19: 120 mg via INTRAVENOUS

## 2018-04-19 MED ORDER — ROCURONIUM BROMIDE 50 MG/5ML IV SOSY
PREFILLED_SYRINGE | INTRAVENOUS | Status: AC
  Filled 2018-04-19: qty 5

## 2018-04-19 MED ORDER — ROCURONIUM BROMIDE 10 MG/ML (PF) SYRINGE
PREFILLED_SYRINGE | INTRAVENOUS | Status: DC | PRN
  Administered 2018-04-19: 40 mg via INTRAVENOUS
  Administered 2018-04-19: 10 mg via INTRAVENOUS

## 2018-04-19 MED ORDER — LIDOCAINE-EPINEPHRINE 1 %-1:100000 IJ SOLN
INTRAMUSCULAR | Status: AC
  Filled 2018-04-19: qty 1

## 2018-04-19 MED ORDER — SUGAMMADEX SODIUM 200 MG/2ML IV SOLN
INTRAVENOUS | Status: DC | PRN
  Administered 2018-04-19: 200 mg via INTRAVENOUS

## 2018-04-19 MED ORDER — FENTANYL CITRATE (PF) 250 MCG/5ML IJ SOLN
INTRAMUSCULAR | Status: AC
  Filled 2018-04-19: qty 5

## 2018-04-19 MED ORDER — LACTATED RINGERS IV SOLN
INTRAVENOUS | Status: DC
  Administered 2018-04-19 (×2): via INTRAVENOUS

## 2018-04-19 MED ORDER — SODIUM CHLORIDE 0.9 % IV SOLN
INTRAVENOUS | Status: DC | PRN
  Administered 2018-04-19: 50 ug/min via INTRAVENOUS

## 2018-04-19 MED ORDER — DEXAMETHASONE SODIUM PHOSPHATE 10 MG/ML IJ SOLN
10.0000 mg | Freq: Once | INTRAMUSCULAR | Status: AC
  Administered 2018-04-19: 10 mg via INTRAVENOUS
  Filled 2018-04-19: qty 1

## 2018-04-19 MED ORDER — STERILE WATER FOR IRRIGATION IR SOLN
Status: DC | PRN
  Administered 2018-04-19: 1000 mL

## 2018-04-19 MED ORDER — LIDOCAINE 2% (20 MG/ML) 5 ML SYRINGE
INTRAMUSCULAR | Status: DC | PRN
  Administered 2018-04-19: 60 mg via INTRAVENOUS

## 2018-04-19 MED ORDER — LEVOTHYROXINE SODIUM 100 MCG PO TABS
200.0000 ug | ORAL_TABLET | Freq: Every day | ORAL | Status: DC
  Administered 2018-04-20 – 2018-04-21 (×2): 200 ug via ORAL
  Filled 2018-04-19 (×2): qty 2

## 2018-04-19 MED ORDER — BACITRACIN ZINC 500 UNIT/GM EX OINT
1.0000 "application " | TOPICAL_OINTMENT | Freq: Three times a day (TID) | CUTANEOUS | Status: DC
  Administered 2018-04-19 – 2018-04-21 (×4): 1 via TOPICAL
  Filled 2018-04-19: qty 28.35

## 2018-04-19 MED ORDER — FENTANYL CITRATE (PF) 100 MCG/2ML IJ SOLN
INTRAMUSCULAR | Status: AC
  Administered 2018-04-19: 50 ug via INTRAVENOUS
  Filled 2018-04-19: qty 2

## 2018-04-19 MED ORDER — PHENYLEPHRINE 40 MCG/ML (10ML) SYRINGE FOR IV PUSH (FOR BLOOD PRESSURE SUPPORT)
PREFILLED_SYRINGE | INTRAVENOUS | Status: DC | PRN
  Administered 2018-04-19 (×3): 80 ug via INTRAVENOUS

## 2018-04-19 SURGICAL SUPPLY — 65 items
ATTRACTOMAT 16X20 MAGNETIC DRP (DRAPES) IMPLANT
BLADE SURG 10 STRL SS (BLADE) ×2 IMPLANT
BLADE SURG 15 STRL LF DISP TIS (BLADE) ×1 IMPLANT
BLADE SURG 15 STRL SS (BLADE) ×3
CANISTER SUCT 3000ML PPV (MISCELLANEOUS) ×3 IMPLANT
CLEANER TIP ELECTROSURG 2X2 (MISCELLANEOUS) ×3 IMPLANT
CONT SPECI 4OZ STER CLIK (MISCELLANEOUS) ×2 IMPLANT
CORD BIPOLAR FORCEPS 12FT (ELECTRODE) ×2 IMPLANT
CORDS BIPOLAR (ELECTRODE) ×1 IMPLANT
COVER SURGICAL LIGHT HANDLE (MISCELLANEOUS) ×3 IMPLANT
DRAIN CHANNEL 15F RND FF W/TCR (WOUND CARE) IMPLANT
DRAIN SNY 10 ROU (WOUND CARE) ×1 IMPLANT
DRAIN SNY 7 FPER (WOUND CARE) ×2 IMPLANT
DRAPE HALF SHEET 40X57 (DRAPES) IMPLANT
DRAPE PROXIMA HALF (DRAPES) ×2 IMPLANT
DRAPE SURG 17X23 STRL (DRAPES) ×4 IMPLANT
ELECT COATED BLADE 2.86 ST (ELECTRODE) ×3 IMPLANT
ELECT REM PT RETURN 9FT ADLT (ELECTROSURGICAL) ×3
ELECTRODE REM PT RTRN 9FT ADLT (ELECTROSURGICAL) ×1 IMPLANT
EVACUATOR SILICONE 100CC (DRAIN) ×3 IMPLANT
FORCEPS BIPOLAR SPETZLER 8 1.0 (NEUROSURGERY SUPPLIES) ×3 IMPLANT
GAUZE 4X4 16PLY RFD (DISPOSABLE) ×1 IMPLANT
GAUZE SPONGE 4X4 16PLY XRAY LF (GAUZE/BANDAGES/DRESSINGS) ×2 IMPLANT
GLOVE BIO SURGEON STRL SZ7 (GLOVE) ×2 IMPLANT
GLOVE BIO SURGEON STRL SZ7.5 (GLOVE) ×2 IMPLANT
GLOVE BIOGEL M 7.0 STRL (GLOVE) ×3 IMPLANT
GLOVE BIOGEL PI IND STRL 6.5 (GLOVE) IMPLANT
GLOVE BIOGEL PI IND STRL 7.0 (GLOVE) IMPLANT
GLOVE BIOGEL PI INDICATOR 6.5 (GLOVE) ×4
GLOVE BIOGEL PI INDICATOR 7.0 (GLOVE) ×2
GLOVE ECLIPSE 6.5 STRL STRAW (GLOVE) ×4 IMPLANT
GLOVE INDICATOR 7.0 STRL GRN (GLOVE) ×2 IMPLANT
GLOVE SURG SS PI 6.5 STRL IVOR (GLOVE) ×4 IMPLANT
GLOVE SURG SS PI 7.0 STRL IVOR (GLOVE) ×2 IMPLANT
GOWN STRL REUS W/ TWL LRG LVL3 (GOWN DISPOSABLE) ×2 IMPLANT
GOWN STRL REUS W/TWL LRG LVL3 (GOWN DISPOSABLE) ×21
KIT BASIN OR (CUSTOM PROCEDURE TRAY) ×3 IMPLANT
KIT TURNOVER KIT B (KITS) ×3 IMPLANT
LOCATOR NERVE 3 VOLT (DISPOSABLE) IMPLANT
LOOP VESSEL MINI RED (MISCELLANEOUS) ×2 IMPLANT
NDL HYPO 25GX1X1/2 BEV (NEEDLE) ×1 IMPLANT
NEEDLE HYPO 25GX1X1/2 BEV (NEEDLE) ×3 IMPLANT
NS IRRIG 1000ML POUR BTL (IV SOLUTION) ×3 IMPLANT
PAD ARMBOARD 7.5X6 YLW CONV (MISCELLANEOUS) ×4 IMPLANT
PENCIL BUTTON HOLSTER BLD 10FT (ELECTRODE) ×3 IMPLANT
SPONGE INTESTINAL PEANUT (DISPOSABLE) ×4 IMPLANT
SPONGE LAP 18X18 X RAY DECT (DISPOSABLE) ×7 IMPLANT
STAPLER VISISTAT 35W (STAPLE) ×3 IMPLANT
SUT CHROMIC 3 0 SH 27 (SUTURE) ×2 IMPLANT
SUT CHROMIC 5 0 P 3 (SUTURE) IMPLANT
SUT ETHILON 2 0 FS 18 (SUTURE) IMPLANT
SUT ETHILON 3 0 FSL (SUTURE) ×2 IMPLANT
SUT SILK 2 0 (SUTURE)
SUT SILK 2 0 SH CR/8 (SUTURE) ×3 IMPLANT
SUT SILK 2-0 18XBRD TIE 12 (SUTURE) IMPLANT
SUT SILK 3 0 REEL (SUTURE) ×5 IMPLANT
SUT SILK 4 0 (SUTURE)
SUT SILK 4 0 REEL (SUTURE) ×2 IMPLANT
SUT SILK 4-0 18XBRD TIE 12 (SUTURE) IMPLANT
SUT VIC AB 3-0 FS2 27 (SUTURE) IMPLANT
SUT VIC AB 3-0 SH 18 (SUTURE) ×2 IMPLANT
SUT VIC AB 4-0 PS2 27 (SUTURE) ×2 IMPLANT
TOWEL OR 17X24 6PK STRL BLUE (TOWEL DISPOSABLE) ×2 IMPLANT
TRAY ENT MC OR (CUSTOM PROCEDURE TRAY) ×3 IMPLANT
TRAY FOLEY MTR SLVR 14FR STAT (SET/KITS/TRAYS/PACK) IMPLANT

## 2018-04-19 NOTE — Anesthesia Preprocedure Evaluation (Addendum)
Anesthesia Evaluation  Patient identified by MRN, date of birth, ID band Patient awake    Reviewed: Allergy & Precautions, NPO status , Patient's Chart, lab work & pertinent test results, reviewed documented beta blocker date and time   Airway Mallampati: III  TM Distance: >3 FB Neck ROM: Full    Dental  (+) Chipped,    Pulmonary neg pulmonary ROS,    Pulmonary exam normal breath sounds clear to auscultation       Cardiovascular hypertension, Pt. on medications and Pt. on home beta blockers Normal cardiovascular exam Rhythm:Regular Rate:Normal  ECG: NSR, rate 100  Cardiologist - Dixie Dials   Neuro/Psych negative neurological ROS  negative psych ROS   GI/Hepatic negative GI ROS, Neg liver ROS,   Endo/Other  diabetes, Oral Hypoglycemic AgentsHypothyroidism   Renal/GU negative Renal ROS     Musculoskeletal Gout   Abdominal   Peds  Hematology negative hematology ROS (+)   Anesthesia Other Findings Left neck mass  Reproductive/Obstetrics                            Anesthesia Physical Anesthesia Plan  ASA: III  Anesthesia Plan: General   Post-op Pain Management:    Induction: Intravenous  PONV Risk Score and Plan: 3 and Dexamethasone, Ondansetron, Midazolam and Treatment may vary due to age or medical condition  Airway Management Planned: Oral ETT  Additional Equipment:   Intra-op Plan:   Post-operative Plan: Extubation in OR  Informed Consent: I have reviewed the patients History and Physical, chart, labs and discussed the procedure including the risks, benefits and alternatives for the proposed anesthesia with the patient or authorized representative who has indicated his/her understanding and acceptance.   Dental advisory given  Plan Discussed with: CRNA  Anesthesia Plan Comments:        Anesthesia Quick Evaluation

## 2018-04-19 NOTE — Anesthesia Postprocedure Evaluation (Signed)
Anesthesia Post Note  Patient: Jamie [name]  Procedure(s) Performed: LIMITED LEFT NECK DISSECTION (Left Neck)     Patient location during evaluation: PACU Anesthesia Type: General Level of consciousness: awake and alert Pain management: pain level controlled Vital Signs Assessment: post-procedure vital signs reviewed and stable Respiratory status: spontaneous breathing, nonlabored ventilation, respiratory function stable and patient connected to nasal cannula oxygen Cardiovascular status: blood pressure returned to baseline and stable Postop Assessment: no apparent nausea or vomiting Anesthetic complications: no    Last Vitals:  Vitals:   04/19/18 1630 04/19/18 1703  BP: (!) 141/82 131/84  Pulse: (!) 113 (!) 114  Resp: 16 17  Temp: 36.8 C 37.2 C  SpO2: 99% 94%    Last Pain:  Vitals:   04/19/18 2045  TempSrc:   PainSc: (P) 3                  Bradie Lacock P Chanese Hartsough

## 2018-04-19 NOTE — H&P (Signed)
Jamie [name] is an 62 y.o. female.   Chief Complaint: Left superior neck mass HPI: Hx of papillary Ca of the thyroid, s/p total thyroidectomy by Dr. Ninfa Linden in 2012. Hx of enlarging left neck mass for 18 months. Bx pos for metastatic thyroid Ca.  Past Medical History:  Diagnosis Date  . Arthritis   . Cancer ([place])    thyroid  . Diabetes mellitus   . Hypertension   . Osteopenia   . Thyroid disease   . Uterine enlargement   . Uterine enlargement     Past Surgical History:  Procedure Laterality Date  . COLONOSCOPY  06/15/2011   Procedure: COLONOSCOPY;  Surgeon: Jeryl Columbia, MD;  Location: [facility] ENDOSCOPY;  Service: Endoscopy;  Laterality: N/A;  . THYROID LOBECTOMY    . TOTAL THYROIDECTOMY  2012    Family History  Problem Relation Age of Onset  . Cancer Father        lung   Social History:  reports that she has never smoked. She has never used smokeless tobacco. She reports that she does not drink alcohol or use drugs.  Allergies: No Known Allergies  Medications Prior to Admission  Medication Sig Dispense Refill  . acetaminophen (TYLENOL) 650 MG CR tablet Take 650 mg by mouth every 8 (eight) hours as needed for pain.    Marland Kitchen allopurinol (ZYLOPRIM) 100 MG tablet Take 100 mg by mouth daily.    Marland Kitchen amLODipine (NORVASC) 5 MG tablet Take 5 mg by mouth daily.  6  . levothyroxine (SYNTHROID, LEVOTHROID) 200 MCG tablet Take 1 tablet (200 mcg total) by mouth daily before breakfast. 30 tablet 1  . metFORMIN (GLUCOPHAGE) 500 MG tablet Take 1 tablet (500 mg total) by mouth 2 (two) times daily with a meal. 60 tablet 1  . metoprolol tartrate (LOPRESSOR) 25 MG tablet Take 25 mg by mouth 2 (two) times daily.  6    Results for orders placed or performed during the hospital encounter of 04/19/18 (from the past 48 hour(s))  Glucose, capillary     Status: Abnormal   Collection Time: 04/19/18  9:07 AM  Result Value Ref Range   Glucose-Capillary 100 (H) 70 - 99 mg/dL  Glucose, capillary     Status:  Abnormal   Collection Time: 04/19/18 10:56 AM  Result Value Ref Range   Glucose-Capillary 101 (H) 70 - 99 mg/dL   No results found.  Review of Systems  Constitutional: Negative.   HENT:       Left Neck Mass  Respiratory: Negative.   Cardiovascular: Negative.     Blood pressure 134/75, pulse 96, temperature 98 F (36.7 C), temperature source Oral, resp. rate 16, height 4\' 11"  (1.499 m), weight 51.9 kg, SpO2 97 %. Physical Exam  Constitutional: She appears well-developed and well-nourished.  HENT:  Head: Normocephalic.  Mouth/Throat: Oropharynx is clear and moist.  Neck: Normal range of motion. Neck supple.  Left superior neck mass  Cardiovascular: Normal rate.  Respiratory: Effort normal.  Lymphadenopathy:    She has cervical adenopathy.     Assessment/Plan Pt adm for Left Neck Dissection under GA.  Jerrell Belfast, MD 04/19/2018, 11:02 AM

## 2018-04-19 NOTE — Anesthesia Procedure Notes (Signed)
Procedure Name: Intubation Date/Time: 04/19/2018 11:32 AM Performed by: Lieutenant Diego, CRNA Pre-anesthesia Checklist: Patient identified, Emergency Drugs available, Suction available and Patient being monitored Patient Re-evaluated:Patient Re-evaluated prior to induction Oxygen Delivery Method: Circle system utilized Preoxygenation: Pre-oxygenation with 100% oxygen Induction Type: IV induction Ventilation: Mask ventilation without difficulty Laryngoscope Size: Mac and 3 Grade View: Grade I Tube type: Oral Tube size: 6.5 mm Number of attempts: 1 Airway Equipment and Method: Stylet and Oral airway Placement Confirmation: ETT inserted through vocal cords under direct vision,  positive ETCO2 and breath sounds checked- equal and bilateral Secured at: 22 cm Tube secured with: Tape Dental Injury: Teeth and Oropharynx as per pre-operative assessment

## 2018-04-19 NOTE — Progress Notes (Signed)
04/19/2018 6:38 PM  Frees, [place] [id number]  Post-Op Check    Temp:  [98 F (36.7 C)-98.9 F (37.2 C)] 98.9 F (37.2 C) (09/25 1703) Pulse Rate:  [96-121] 114 (09/25 1703) Resp:  [16-19] 17 (09/25 1703) BP: (131-147)/(75-94) 131/84 (09/25 1703) SpO2:  [91 %-99 %] 94 % (09/25 1703) Weight:  [51.9 kg] 51.9 kg (09/25 0923),     Intake/Output Summary (Last 24 hours) at 04/19/2018 1838 Last data filed at 04/19/2018 1837 Gross per 24 hour  Intake 1000 ml  Output 840 ml  Net 160 ml    Results for orders placed or performed during the hospital encounter of 04/19/18 (from the past 24 hour(s))  Glucose, capillary     Status: Abnormal   Collection Time: 04/19/18  9:07 AM  Result Value Ref Range   Glucose-Capillary 100 (H) 70 - 99 mg/dL  Glucose, capillary     Status: Abnormal   Collection Time: 04/19/18 10:56 AM  Result Value Ref Range   Glucose-Capillary 101 (H) 70 - 99 mg/dL  Glucose, capillary     Status: Abnormal   Collection Time: 04/19/18  2:32 PM  Result Value Ref Range   Glucose-Capillary 150 (H) 70 - 99 mg/dL    SUBJECTIVE:  Neck is numb.  Breathing OK.  Voiding.    OBJECTIVE:  Neck flat.  Drain functional.  r mandibularis, CN XI, XII intact  IMPRESSION:  Satisfactory check  PLAN:  Advance diet as comfortable.  Ileene Hutchinson [facility]

## 2018-04-19 NOTE — Op Note (Signed)
Operative Note: NECK DISSECTION  Patient: Harrell record number: [id number]  Date:04/19/2018  Pre-operative Indications: Metastatic papillary carcinoma of the thyroid  Postoperative Indications: Same  Surgical Procedure: Left Neck Dissection including zones 1, 2, 3 and 4  Anesthesia: GET  Surgeon: Delsa Bern, M.D.  Assist: Dr. Melida Quitter  Complications: None  EBL: 100 cc   Brief History: The patient is a 62 y.o. female with a history of papillary carcinoma of the thyroid.  She underwent total thyroidectomy in 2011 by Dr. Ninfa Linden.  Follow-up treatment including radioactive iodine therapy was performed.  The patient was stable until approximately 18 months ago when she developed swelling in the left superior lateral neck.  Needle biopsy consistent with metastatic papillary carcinoma of the thyroid.  The patient deferred surgery at that time.  She was admitted to the hospital 6 weeks ago with acute swelling and pain in the left lateral neck.  CT scan at that time showed a large cystic mass in the left superior lateral neck at the site of the previous metastatic carcinoma.  The patient had acute symptoms of pain and fever was treated for infection.  Return for reevaluation. Given the patient's history and findings I recommended left neck dissection under general anesthesia, risks and benefits were discussed in detail with the patient and their family. They understand and agree with our plan for surgery which is scheduled at [facility] on an elective basis.  Surgical Procedure: The patient is brought to the operating room on 04/19/2018 and placed in supine position on the operating table. General endotracheal anesthesia was established without difficulty. When the patient was adequately anesthetized, surgical timeout was performed and correct identification of the patient and the surgical procedure. The patient was positioned and prepped and draped in sterile fashion.  The  patient's left neck was then injected with 5 cc of 1% lidocaine 1:100,000 dilution epinephrine in a subcutaneous fashion along the proposed skin incision.  After allowing adequate time for vasoconstriction and hemostasis, the procedure was begun by creating a skin incision using a #15 scalpel.  Incision was carried from the left postauricular region inferiorly along the neck and extending across the lower neck in a pre-existing skin crease.  Using Bovie electrocautery,  dissection was carried out through the skin, underlying subcutaneous tissue and platysma muscle.  Subplatysmal flaps were then elevated superiorly and inferiorly to allow access to the structures of the neck.  The sternocleidomastoid muscle was identified, fascia along the lateral aspect was then elevated anteriorly and dissection was carried along the anterior and medial aspect of the sternocleidomastoid muscle from superior to inferior elevating soft tissue and lymphoid tissue anteriorly.  The jugular vein, carotid artery and vagus nerve were identified and preserved throughout their course.  In the superior lateral aspect of the dissection the spinal accessory nerve was identified and preserved, overlying soft tissue was carefully dissected and divided.  The posterior belly of the digastric muscle was identified with dissection lateral to the digastric muscle, dividing the soft tissue.  The common facial vein was identified divided and suture-ligated.  The hypoglossal nerve was identified and preserved throughout its course.  Dissection was begun along the superior aspect of zone 1 of the neck, identifying the submandibular gland.  The superficial fascia of the submandibular gland was elevated and the marginal mandibular nerve was identified and preserved.  The gland was then carefully dissected, the vascular contributions were divided and suture-ligated.  The lingual nerve was identified and its  contribution to the gland was divided.  The gland  was then dissected inferiorly, the submandibular duct was identified and divided.  Mylohyoid muscle was elevated anteriorly and soft tissue was then dissected from the anterior aspect of the submandibular triangle.  Dissection was then carried along the midline soft tissue to the level of the strap muscles.  The omohyoid muscle was then identified along the inferior aspect of the dissection, the muscle was divided and dissection was carried out deep to the sternocleidomastoid muscle to the level of the supraclavicular space.  Dissection was then carried through the fibrofatty soft tissue from posterior to anterior in a soft tissue plane with Bovie electrocautery and blunt and sharp dissection.  The jugular vein was identified in its entire course and the overlying soft tissue was carefully elevated, venous branches were divided and suture-ligated and the soft tissue was elevated further anteriorly.  The carotid artery was identified throughout its course and the soft tissue was carefully dissected free from the carotid artery.  The patient had dense adhesions between the tumor and the external branch of the carotid artery.  We were unable to dissected the external branch free of tumor and opted to undertake dissection of the proximal portion of the external carotid artery.  The external branch was carefully dissected and then divided and suture-ligated with 2-0 silk suture.  The entire neck dissection specimen was then removed and marked for pathology.  This was sent for gross and microscopic evaluation.  The patient's neck was then irrigated with saline, Valsalva maneuver was performed to check hemostasis.  Bleeding was controlled with bipolar cautery and suture ligature.  A 7 mm flat drain was then placed in the depth of the incision and carried out through a separate stab incision in the anterior neck skin.  This was sutured with 3-0 Ethilon suture.  Incision was then closed in multiple layers beginning with  reapproximation of the platysma muscle with interrupted 3-0 Vicryl.  Immediate subcutaneous tissue was closed with interrupted 4-0 Vicryl suture.  Final skin closure was achieved with surgical staples.  Wound was dressed with bacitracin ointment.   An orogastric tube was passed and stomach contents were aspirated. Patient was awakened from anesthetic and transferred from the operating room to the recovery room in stable condition. There were no complications and blood loss was minimal.   Delsa Bern, M.D. [facility] 04/19/2018

## 2018-04-19 NOTE — Transfer of Care (Signed)
Immediate Anesthesia Transfer of Care Note  Patient: Jamie [name]  Procedure(s) Performed: LIMITED LEFT NECK DISSECTION (Left Neck)  Patient Location: PACU  Anesthesia Type:General  Level of Consciousness: awake and alert   Airway & Oxygen Therapy: Patient Spontanous Breathing and Patient connected to face mask oxygen  Post-op Assessment: Report given to RN and Post -op Vital signs reviewed and stable  Post vital signs: Reviewed and stable  Last Vitals:  Vitals Value Taken Time  BP 140/94 04/19/2018  2:31 PM  Temp    Pulse 104 04/19/2018  2:32 PM  Resp 19 04/19/2018  2:32 PM  SpO2 92 % 04/19/2018  2:32 PM  Vitals shown include unvalidated device data.  Last Pain:  Vitals:   04/19/18 0923  TempSrc:   PainSc: 0-No pain         Complications: No apparent anesthesia complications

## 2018-04-20 ENCOUNTER — Encounter (HOSPITAL_COMMUNITY): Payer: Self-pay | Admitting: Otolaryngology

## 2018-04-20 LAB — GLUCOSE, CAPILLARY
GLUCOSE-CAPILLARY: 127 mg/dL — AB (ref 70–99)
Glucose-Capillary: 163 mg/dL — ABNORMAL HIGH (ref 70–99)

## 2018-04-20 NOTE — Progress Notes (Signed)
   ENT Progress Note: POD #1 s/p Procedure(s): LIMITED LEFT NECK DISSECTION   Subjective: Stable, min discomfort  Objective: Vital signs in last 24 hours: Temp:  [97.8 F (36.6 C)-98.9 F (37.2 C)] 98 F (36.7 C) (09/26 1036) Pulse Rate:  [97-121] 103 (09/26 1036) Resp:  [16-19] 16 (09/26 1036) BP: (104-147)/(70-94) 131/86 (09/26 1036) SpO2:  [91 %-99 %] 96 % (09/26 1036) Weight change:  Last BM Date: 04/19/18  Intake/Output from previous day: 09/25 0701 - 09/26 0700 In: 1000 [I.V.:1000] Out: 858 [Urine:800; Drains:33; Blood:25] Intake/Output this shift: Total I/O In: 120 [P.O.:120] Out: -   Labs: No results for input(s): WBC, HGB, HCT, PLT in the last 72 hours. No results for input(s): NA, K, CL, CO2, GLUCOSE, BUN, CALCIUM in the last 72 hours.  Invalid input(s): CREATININR  Studies/Results: No results found.   PHYSICAL EXAM: Inc intact JP with mod output No erythema or swelling   Assessment/Plan: Stable Cont postop care Plan d/c in am 9/27    Jamie [name] 04/20/2018, 12:45 PM

## 2018-04-21 LAB — GLUCOSE, CAPILLARY: GLUCOSE-CAPILLARY: 91 mg/dL (ref 70–99)

## 2018-04-21 NOTE — Discharge Summary (Signed)
Physician Discharge Summary  Patient ID: Jamie [name] MRN: [id number] DOB/AGE: 12/09/1955 62 y.o.  Admit date: 04/19/2018 Discharge date: 04/21/2018  Admission Diagnoses:  Active Problems:   Neck mass   Discharge Diagnoses:  Same  Surgeries: Procedure(s): LIMITED LEFT NECK DISSECTION on 04/19/2018   Consultants: None  Discharged Condition: Improved  Hospital Course: Jamie [name] is an 62 y.o. female who was admitted 04/19/2018 with a diagnosis of Active Problems:   Neck mass  and went to the operating room on 04/19/2018 and underwent the above named procedures.   Patient stable on POD #2. Drain removed. Patient d/c'ed to home.  Recent vital signs:  Vitals:   04/20/18 2209 04/21/18 0532  BP: 129/79 118/79  Pulse: 99 95  Resp: 18 16  Temp: 98.6 F (37 C) 98.8 F (37.1 C)  SpO2: 96% 97%    Recent laboratory studies:  Results for orders placed or performed during the hospital encounter of 04/19/18  Glucose, capillary  Result Value Ref Range   Glucose-Capillary 100 (H) 70 - 99 mg/dL  Glucose, capillary  Result Value Ref Range   Glucose-Capillary 101 (H) 70 - 99 mg/dL  Glucose, capillary  Result Value Ref Range   Glucose-Capillary 150 (H) 70 - 99 mg/dL  Glucose, capillary  Result Value Ref Range   Glucose-Capillary 163 (H) 70 - 99 mg/dL  Glucose, capillary  Result Value Ref Range   Glucose-Capillary 127 (H) 70 - 99 mg/dL  Glucose, capillary  Result Value Ref Range   Glucose-Capillary 91 70 - 99 mg/dL    Discharge Medications:   Allergies as of 04/21/2018   No Known Allergies     Medication List    TAKE these medications   acetaminophen 650 MG CR tablet Commonly known as:  TYLENOL Take 650 mg by mouth every 8 (eight) hours as needed for pain.   allopurinol 100 MG tablet Commonly known as:  ZYLOPRIM Take 100 mg by mouth daily.   amLODipine 5 MG tablet Commonly known as:  NORVASC Take 5 mg by mouth daily.   HYDROcodone-acetaminophen 5-325 MG tablet Commonly known  as:  NORCO/VICODIN Take 1-2 tablets by mouth every 6 (six) hours as needed for up to 5 days for moderate pain.   levothyroxine 200 MCG tablet Commonly known as:  SYNTHROID, LEVOTHROID Take 1 tablet (200 mcg total) by mouth daily before breakfast.   metFORMIN 500 MG tablet Commonly known as:  GLUCOPHAGE Take 1 tablet (500 mg total) by mouth 2 (two) times daily with a meal.   metoprolol tartrate 25 MG tablet Commonly known as:  LOPRESSOR Take 25 mg by mouth 2 (two) times daily.       Diagnostic Studies: No results found.  Disposition: Discharge disposition: 01-Home or Self Care       Discharge Instructions    Diet - low sodium heart healthy   Complete by:  As directed    Diet - low sodium heart healthy   Complete by:  As directed    Discharge instructions   Complete by:  As directed    1. Limited activity 2. Liquid and soft diet, advance as tolerated 3. May bathe and shower, keep incision dry for 3 days postop 4. Wound care - 1/2 str H2O2 and bacitracin ointment twice daily 5. Elevate Head of Bed   Please contact [facility] [phone]) for any additional concerns.   Increase activity slowly   Complete by:  As directed    Increase activity slowly   Complete by:  As directed       Follow-up Information    Jerrell Belfast, MD In 10 days.   Specialty:  Otolaryngology Contact information: [address] [place] [address] [phone]            Signed: Kristina Mcnorton 04/21/2018, 10:07 AM

## 2018-04-21 NOTE — Progress Notes (Signed)
Discharge instructions, RX's and follow up appts explained and provided to patient and c/g verbalized understanding. Patient left floor via wheelchair accompanied by staff no c/o pain or shortness of breath at d/c.  Yasenia Reedy, Tivis Ringer, RN

## 2018-04-21 NOTE — Progress Notes (Signed)
   ENT Progress Note: POD #2 s/p Procedure(s): LIMITED LEFT NECK DISSECTION   Subjective: Stable, sore throat  Objective: Vital signs in last 24 hours: Temp:  [98 F (36.7 C)-98.8 F (37.1 C)] 98.8 F (37.1 C) (09/27 0532) Pulse Rate:  [95-103] 95 (09/27 0532) Resp:  [16-18] 16 (09/27 0532) BP: (118-131)/(79-86) 118/79 (09/27 0532) SpO2:  [96 %-97 %] 97 % (09/27 0532) Weight change:  Last BM Date: 04/19/18  Intake/Output from previous day: 09/26 0701 - 09/27 0700 In: 834.5 [P.O.:360; I.V.:474.5] Out: 20 [Drains:20] Intake/Output this shift: Total I/O In: 103 [I.V.:103] Out: 0   Labs: No results for input(s): WBC, HGB, HCT, PLT in the last 72 hours. No results for input(s): NA, K, CL, CO2, GLUCOSE, BUN, CALCIUM in the last 72 hours.  Invalid input(s): CREATININR  Studies/Results: No results found.   PHYSICAL EXAM: Inc intact No erythema or swelling JP removed   Assessment/Plan: Stable on POD #2 D/C to home    Jamie [name] 04/21/2018, 10:03 AM

## 2018-04-21 NOTE — Plan of Care (Signed)

## 2018-05-01 ENCOUNTER — Other Ambulatory Visit: Payer: Self-pay | Admitting: Internal Medicine

## 2018-05-01 DIAGNOSIS — R7303 Prediabetes: Secondary | ICD-10-CM

## 2018-05-01 MED FILL — METOPROLOL TARTRATE 25 MG T: 25 | 30 days supply | Qty: 60 | Fill #3

## 2018-05-01 MED FILL — AMLODIPINE BESYLATE 5 MG TA: 5 | 30 days supply | Qty: 30 | Fill #5

## 2018-05-01 MED FILL — ALLOPURINOL 100 MG TABLET: 100 | 30 days supply | Qty: 30 | Fill #1

## 2018-06-09 MED FILL — METOPROLOL TARTRATE 25 MG T: 25 | 30 days supply | Qty: 60 | Fill #4

## 2018-06-09 MED FILL — AMLODIPINE BESYLATE 5 MG TA: 5 | 30 days supply | Qty: 30 | Fill #6

## 2018-06-09 MED FILL — ALLOPURINOL 100 MG TABLET: 100 | 30 days supply | Qty: 30 | Fill #2

## 2018-07-04 MED FILL — METOPROLOL SUCCINATE ER 25: 25 | 30 days supply | Qty: 30 | Fill #0

## 2018-07-04 MED FILL — LEVOTHYROXINE 200 MCG TAB: 200 | 30 days supply | Qty: 30 | Fill #0

## 2018-07-04 MED FILL — AMLODIPINE BESYLATE 5 MG TA: 5 | 30 days supply | Qty: 30 | Fill #0

## 2018-07-04 MED FILL — metFORMIN HCL 500 MG TABS: 500 | 30 days supply | Qty: 30 | Fill #0

## 2018-07-31 MED FILL — LEVOTHYROXINE 200 MCG TAB: 200 | 30 days supply | Qty: 30 | Fill #1

## 2018-07-31 MED FILL — ALLOPURINOL 100 MG TABLET: 100 | 30 days supply | Qty: 30 | Fill #3

## 2018-07-31 MED FILL — AMLODIPINE BESYLATE 5 MG TA: 5 | 30 days supply | Qty: 30 | Fill #1

## 2018-07-31 MED FILL — METOPROLOL TARTRATE 25 MG T: 25 | 30 days supply | Qty: 60 | Fill #5

## 2018-09-05 MED FILL — LEVOTHYROXINE 200 MCG TAB: 200 | 30 days supply | Qty: 30 | Fill #2

## 2018-09-12 MED FILL — AMLODIPINE BESYLATE 5 MG TA: 5 | 30 days supply | Qty: 30 | Fill #2

## 2018-09-28 MED FILL — AMLODIPINE BESYLATE 5 MG TA: 5 | 30 days supply | Qty: 30 | Fill #3

## 2018-09-28 MED FILL — LEVOTHYROXINE 200 MCG TAB: 200 | 30 days supply | Qty: 30 | Fill #3

## 2018-10-26 MED FILL — AMLODIPINE BESYLATE 5 MG TA: 5 | 30 days supply | Qty: 30 | Fill #4

## 2018-10-26 MED FILL — LEVOTHYROXINE 200 MCG TAB: 200 | 30 days supply | Qty: 30 | Fill #4

## 2018-11-30 MED FILL — AMLODIPINE BESYLATE 5 MG TA: 5 | 30 days supply | Qty: 30 | Fill #5

## 2018-11-30 MED FILL — LEVOTHYROXINE 200 MCG TAB: 200 | 30 days supply | Qty: 30 | Fill #5

## 2018-12-25 MED FILL — LEVOTHYROXINE 200 MCG TAB: 200 | 30 days supply | Qty: 30 | Fill #6

## 2019-01-02 MED FILL — AMLODIPINE BESYLATE 5 MG TA: 5 | 30 days supply | Qty: 30 | Fill #6

## 2019-01-17 MED FILL — ?METOPROLOL 25 MG TABLET: 25 | 30 days supply | Qty: 60 | Fill #0

## 2019-01-17 MED FILL — ?ALLOPURINOL 100MG TABLET: 100 | 30 days supply | Qty: 30 | Fill #0

## 2019-01-17 MED FILL — metFORMIN HCL 500 MG TABS: 500 | 30 days supply | Qty: 30 | Fill #0

## 2019-01-29 MED FILL — ?LEVOTHYROXINE 200 MCG TAB: 200 MCG | 30 days supply | Qty: 30 | Fill #0

## 2019-01-29 MED FILL — ?AMLODIPINE BESYLATE 5MG TA: 5 | 30 days supply | Qty: 30 | Fill #0

## 2019-02-28 MED FILL — LEVOTHYROXINE 200 MCG TAB: 200 | 30 days supply | Qty: 30 | Fill #1

## 2019-03-05 MED FILL — ?METOPROLOL 25 MG TABLET: 25 | 30 days supply | Qty: 60 | Fill #1

## 2019-03-05 MED FILL — ?ALLOPURINOL 100MG TABLET: 100 | 30 days supply | Qty: 30 | Fill #1

## 2019-03-05 MED FILL — ?AMLODIPINE BESYLATE 5MG TA: 5 | 30 days supply | Qty: 30 | Fill #1

## 2019-03-05 MED FILL — metFORMIN HCL 500 MG TABS: 500 | 30 days supply | Qty: 30 | Fill #1

## 2019-03-20 ENCOUNTER — Other Ambulatory Visit: Payer: Self-pay

## 2019-03-20 DIAGNOSIS — Z20822 Contact with and (suspected) exposure to covid-19: Secondary | ICD-10-CM

## 2019-03-21 LAB — NOVEL CORONAVIRUS, NAA: SARS-CoV-2, NAA: DETECTED — AB

## 2019-03-22 ENCOUNTER — Encounter: Payer: Self-pay | Admitting: Critical Care Medicine

## 2019-03-22 ENCOUNTER — Telehealth: Payer: Self-pay | Admitting: Critical Care Medicine

## 2019-03-22 NOTE — Telephone Encounter (Signed)
I connected with this patient through a [place] interpreter.  The patient's had symptoms including body aches fatigue and fever that began on August 22.  The fever continued for 2 days.  Now she has body aches only.  She has no GI symptoms loss of taste or smell.  She has no cough or shortness of breath or headaches.  She does have some back and body aches.  The patient was tested at our church event for COVID on August 25 and is positive.  I indicated to the patient she needs to stay in isolation at least until September 3.  The health department may be in touch with her.  She is unclear as to contact.  I spoke to her daughter as well.  She is now having symptoms but tested negative at the same event.  Her symptoms began after her mother.  I did write an order for both her and the patient's husband the daughter's father to go through a testing event again at [facility] for repeat testing

## 2019-03-29 MED FILL — LEVOTHYROXINE 200 MCG TAB: 200 | 30 days supply | Qty: 30 | Fill #2

## 2019-04-16 MED FILL — ?AMLODIPINE BESYLATE 5MG TA: 5 | 30 days supply | Qty: 30 | Fill #2

## 2019-04-30 MED FILL — LEVOTHYROXINE 200 MCG TAB: 200 | 30 days supply | Qty: 30 | Fill #3

## 2019-05-14 MED FILL — ?ALLOPURINOL 100MG TABLET: 100 | 30 days supply | Qty: 30 | Fill #2

## 2019-05-14 MED FILL — ?AMLODIPINE BESYLATE 5MG TA: 5 | 30 days supply | Qty: 30 | Fill #3

## 2019-05-31 MED FILL — ?LEVOTHYROXINE 200 MCG TAB: 200 MCG | 30 days supply | Qty: 30 | Fill #4

## 2019-06-18 MED FILL — ?AMLODIPINE BESYLATE 5 MG T: 5 MG | 30 days supply | Qty: 30 | Fill #4

## 2019-06-25 MED FILL — ?METOPROLOL 25 MG TABLET: 25 | 30 days supply | Qty: 60 | Fill #2

## 2019-07-02 MED FILL — ?LEVOTHYROXINE 200 MCG TAB: 200 MCG | 30 days supply | Qty: 30 | Fill #5

## 2019-07-17 MED FILL — AMLODIPINE BESYLATE 5 MG TA: 5 | 30 days supply | Qty: 30 | Fill #5

## 2019-07-30 MED FILL — ?LEVOTHYROXINE 200 MCG TAB: 200 MCG | 30 days supply | Qty: 30 | Fill #6

## 2019-08-16 MED FILL — AMLODIPINE BESYLATE 5 MG TA: 5 | 30 days supply | Qty: 30 | Fill #6

## 2019-08-16 MED FILL — ?METOPROLOL 25 MG TABLET: 25 | 30 days supply | Qty: 60 | Fill #3

## 2019-09-04 MED FILL — ?LEVOTHYROXINE 200 MCG TAB: 200 MCG | 30 days supply | Qty: 30 | Fill #0

## 2019-09-10 MED FILL — AMLODIPINE BESYLATE 5 MG TA: 5 | 30 days supply | Qty: 30 | Fill #0

## 2019-10-01 MED FILL — ?LEVOTHYROXINE 200 MCG TAB: 200 MCG | 30 days supply | Qty: 30 | Fill #1

## 2019-10-15 MED FILL — AMLODIPINE BESYLATE 5 MG TA: 5 | 30 days supply | Qty: 30 | Fill #1

## 2019-11-02 MED FILL — ?LEVOTHYROXINE 200 MCG TAB: 200 MCG | 30 days supply | Qty: 30 | Fill #2

## 2019-11-14 MED FILL — AMLODIPINE BESYLATE 5 MG TA: 5 | 30 days supply | Qty: 30 | Fill #2

## 2019-11-14 MED FILL — ?METOPROLOL TART 25MG TABLE: 25 | 30 days supply | Qty: 60 | Fill #4

## 2019-12-03 ENCOUNTER — Other Ambulatory Visit: Payer: Self-pay | Admitting: Cardiovascular Disease

## 2019-12-19 MED FILL — ?ALLOPURINOL 100MG TABLET: 100 | 30 days supply | Qty: 30 | Fill #3

## 2019-12-19 MED FILL — ?AMLODIPINE BESYLATE 5MG TA: 5 | 30 days supply | Qty: 30 | Fill #3

## 2020-01-14 MED FILL — ?AMLODIPINE BESYLATE 5MG TA: 5 | 30 days supply | Qty: 30 | Fill #4

## 2020-02-04 MED FILL — LEVOTHYROXINE SODIUM 100 MC: 100 | 30 days supply | Qty: 60 | Fill #1

## 2020-02-19 MED FILL — AMLODIPINE BESYLATE 5 MG TA: 5 | 30 days supply | Qty: 30 | Fill #5

## 2020-02-20 ENCOUNTER — Other Ambulatory Visit: Payer: Self-pay | Admitting: Cardiovascular Disease

## 2020-02-20 MED FILL — METOPROLOL TARTRATE 25 MG T: 25 | 30 days supply | Qty: 60 | Fill #0

## 2020-02-20 MED FILL — ALLOPURINOL 100 MG TABLET: 100 | 30 days supply | Qty: 30 | Fill #0

## 2020-03-06 MED FILL — LEVOTHYROXINE SODIUM 100 MC: 100 | 30 days supply | Qty: 60 | Fill #2

## 2020-03-06 MED FILL — AMLODIPINE BESYLATE 5 MG TA: 5 | 30 days supply | Qty: 30 | Fill #6

## 2020-03-07 MED FILL — METOPROLOL TARTRATE 25 MG T: 25 | 30 days supply | Qty: 60 | Fill #0

## 2020-03-07 MED FILL — ALLOPURINOL 100 MG TABLET: 100 | 30 days supply | Qty: 30 | Fill #0

## 2020-04-02 ENCOUNTER — Other Ambulatory Visit: Payer: Self-pay | Admitting: Cardiovascular Disease

## 2020-04-02 MED FILL — LEVOTHYROXINE SODIUM 100 MC: 100 | 30 days supply | Qty: 60 | Fill #3

## 2020-04-03 MED FILL — AMLODIPINE BESYLATE 5 MG TA: 5 | 30 days supply | Qty: 30 | Fill #0

## 2020-05-05 MED FILL — LEVOTHYROXINE SODIUM 100 MC: 100 | 30 days supply | Qty: 60 | Fill #4

## 2020-05-21 MED FILL — ALLOPURINOL 100 MG TABLET: 100 | 30 days supply | Qty: 30 | Fill #1

## 2020-05-21 MED FILL — METOPROLOL TARTRATE 25 MG T: 25 | 30 days supply | Qty: 60 | Fill #1

## 2020-05-21 MED FILL — AMLODIPINE BESYLATE 5 MG TA: 5 | 30 days supply | Qty: 30 | Fill #1

## 2020-06-02 IMAGING — CT CT NECK W/ CM
5 of 6 series · 14 of 33 positions shown, 16 images · IV contrast (omnipaque)
Comparison: 03/02/2017

CLINICAL DATA: Left-sided neck swelling over the last 3 days with
difficulty swallowing. History of thyroid cancer.

EXAM:
CT NECK WITH CONTRAST
TECHNIQUE: Multidetector CT imaging of the neck was performed using the
standard protocol following the bolus administration of intravenous
contrast.
CONTRAST:  75mL OMNIPAQUE IOHEXOL 300 MG/ML  SOLN

[Series 3: axial neck · axial · 0.50mm/px · z∈[-277,-205]mm · 2 of 110 slices shown]
[im 37/110  bone]
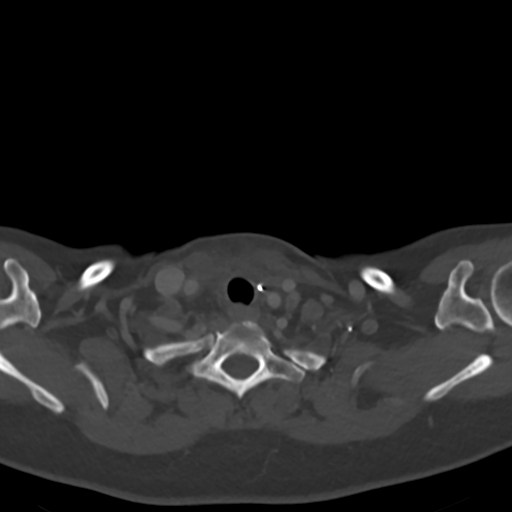
[im 73/110  bone]
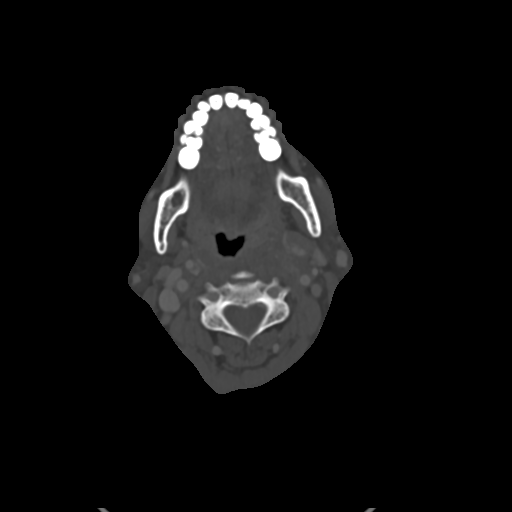

[Series 5: axial bone · axial · 0.50mm/px · z∈[-277,-205]mm · 2 of 110 slices shown]
[im 37/110  bone]
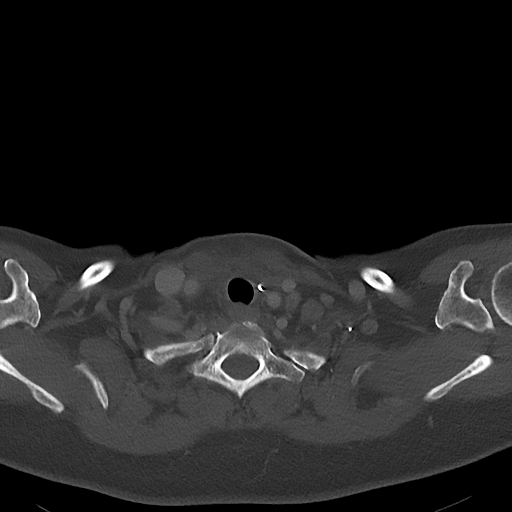
[im 73/110  bone]
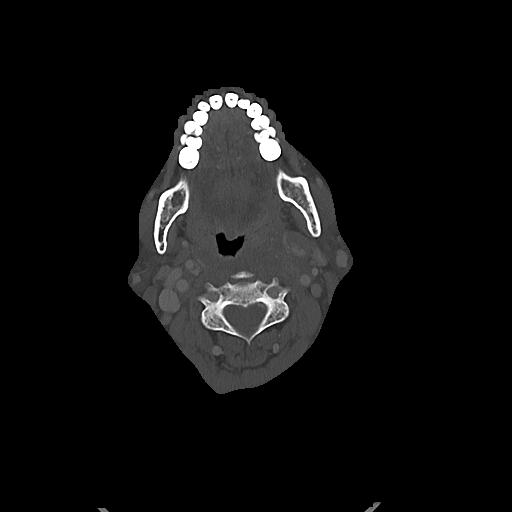

[Series 6: sag neck · sagittal · 0.45mm/px · 5 of 66 slices shown, 6 images]
[im 22/66  bone]
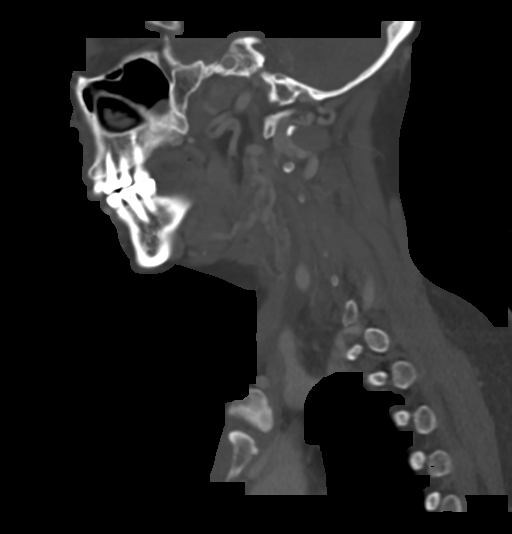
[im 28/66  bone]
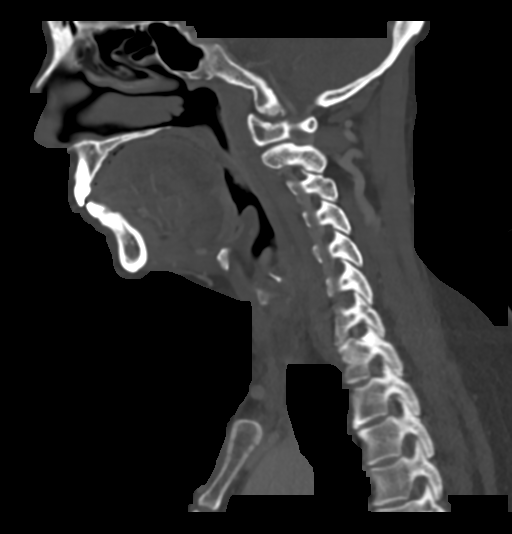
[im 33/66  soft-tissue]
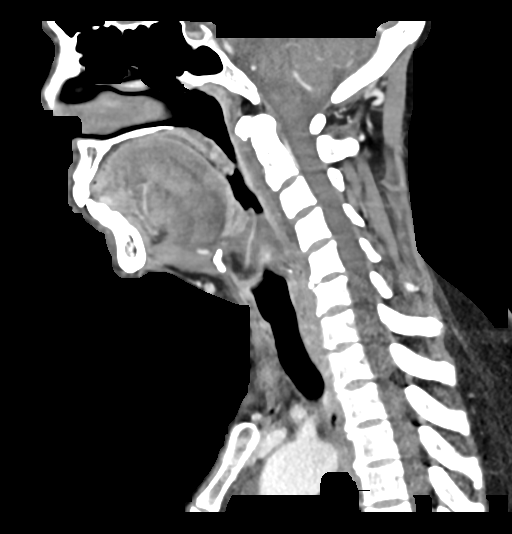
[im 33/66  bone]
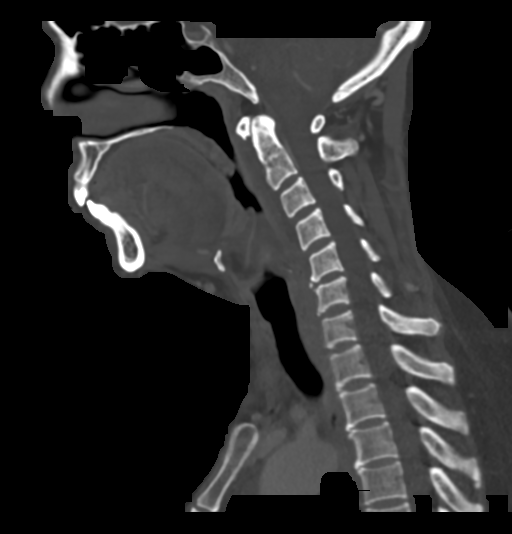
[im 38/66  bone]
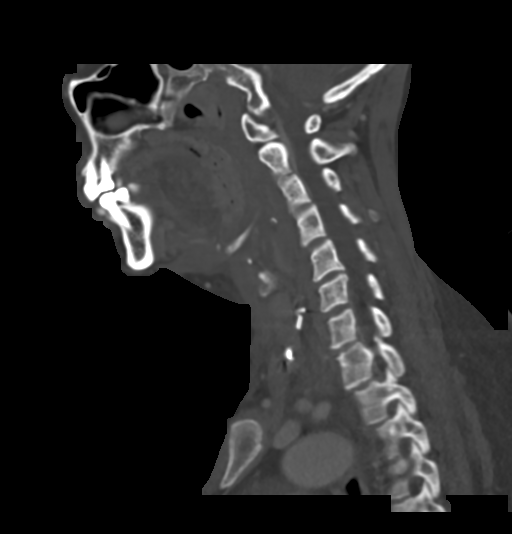
[im 44/66  bone]
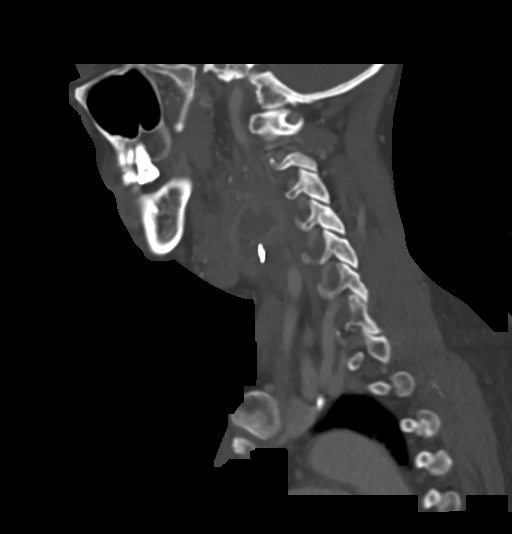

[Series 7: cor neck · coronal · 0.44mm/px · 3 of 90 slices shown]
[im 18/90  bone]
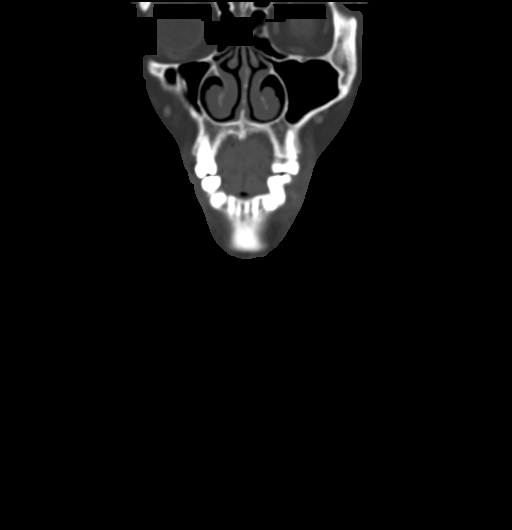
[im 36/90  bone]
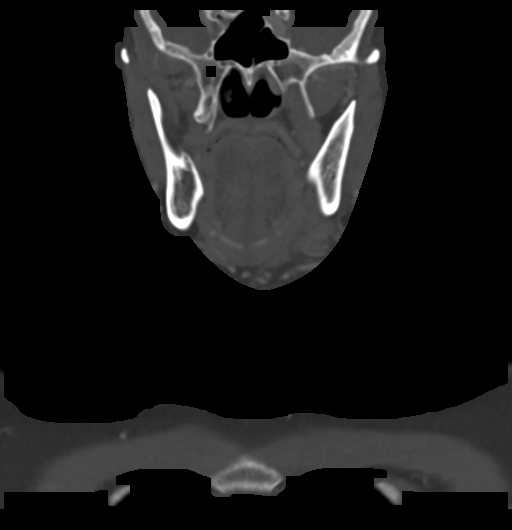
[im 54/90  bone]
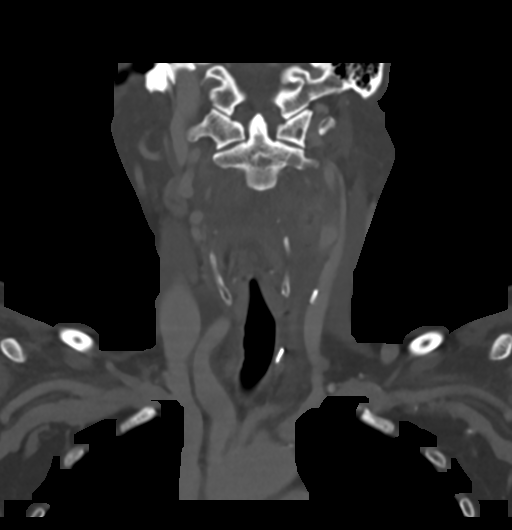

[Series 8: ax oropharynx · axial · 0.44mm/px · z∈[-289,-213]mm · 2 of 116 slices shown, 3 images]
[im 39/116  soft-tissue]
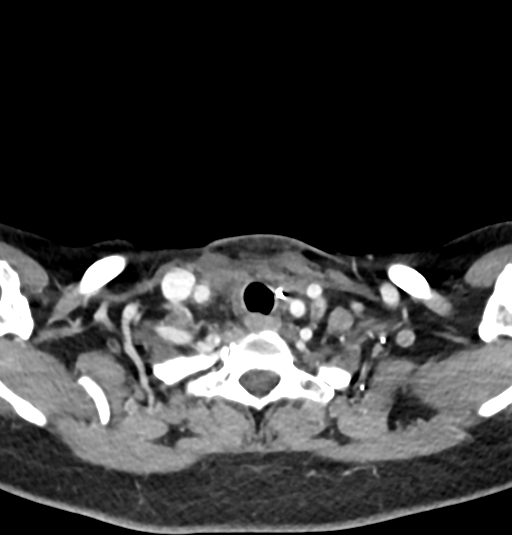
[im 39/116  bone]
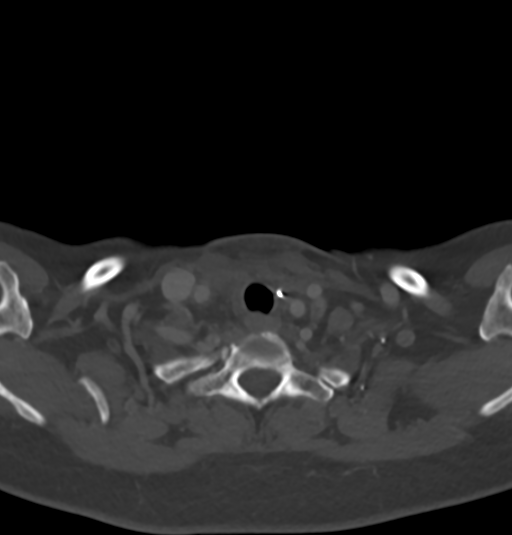
[im 77/116  bone]
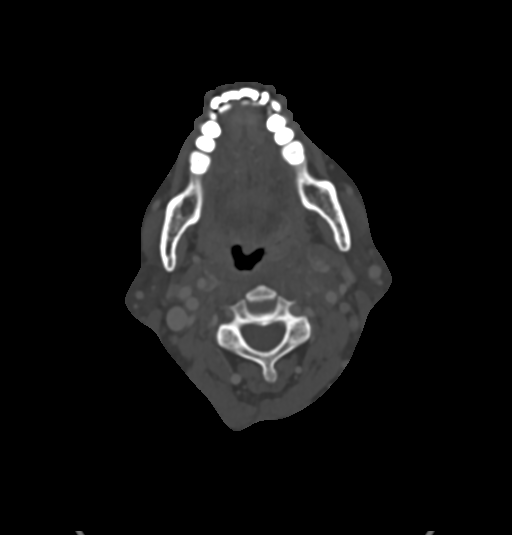

[14 of 33 positions shown; findings below may reference images not displayed]

FINDINGS: Pharynx and larynx: No mucosal mass is identified. There is mass
effect due to a necrotic mass in the left neck as described below,
with displacement of the oropharynx and hypopharynx towards the
right. There is some fluid accumulated within the piriform sinus on
the left. There is mild edema of the parapharyngeal space.

Salivary glands: Right submandibular gland is normal. Both parotid
glands are normal. The left submandibular gland is displaced by the
necrotic nodal mass described below. The left submandibular gland
does appear questionably slightly enlarged and I wondered about the
possibility of tumor extension into the submandibular gland, but
that is not certain.

Thyroid: Previous thyroidectomy.

Lymph nodes: Enlargement of a nodal mass at level 2 on the left, the
area of the previous biopsy in [REDACTED] of last year. This now
measures 2.7 cm in diameter and shows central necrosis. Adjacent
hyperdense level 2 node superficial to that measures up to 14 mm in
diameter and may also be involved by metastatic disease. No necrosis
of that node. Hyperdense level 4 nodes on the left also worrisome
for metastatic disease.

Vascular: Arterial and venous structures are patent.

Limited intracranial: Negative

Visualized orbits: Negative

Mastoids and visualized paranasal sinuses: Clear

Skeleton: Ordinary cervical spondylosis.

Upper chest: Negative

Other: None
IMPRESSION: Enlargement and central necrosis of a metastatic lymph node, level 2
on the left, now measuring up to 2.7 cm in diameter. Additional
smaller metastatic nodes on the left levels 2 and 4. Displacement of
the left submandibular gland with left submandibular enlargement.
Some question about the potential for direct invasion of the left
submandibular gland.

Mass effect related to the large necrotic lesion. Some edema in the
parapharyngeal space as well. Mass-effect upon the hypopharynx and
oropharynx with deviation towards the right. It is possible there
could be superimposed infectious inflammatory change particularly in
the area of necrosis.

## 2020-06-03 MED FILL — LEVOTHYROXINE SODIUM 100 MC: 100 | 30 days supply | Qty: 60 | Fill #5

## 2020-06-25 MED FILL — AMLODIPINE BESYLATE 5 MG TA: 5 | 30 days supply | Qty: 30 | Fill #2

## 2020-07-02 MED FILL — LEVOTHYROXINE SODIUM 100 MC: 100 | 30 days supply | Qty: 60 | Fill #6

## 2020-07-22 MED FILL — AMLODIPINE BESYLATE 5 MG TA: 5 | 30 days supply | Qty: 30 | Fill #3

## 2020-08-06 MED FILL — ALLOPURINOL 100 MG TABLET: 100 | 30 days supply | Qty: 30 | Fill #2

## 2020-08-06 MED FILL — METOPROLOL TARTRATE 25 MG T: 25 | 30 days supply | Qty: 60 | Fill #2

## 2020-08-13 ENCOUNTER — Other Ambulatory Visit: Payer: Self-pay | Admitting: Cardiovascular Disease

## 2020-08-13 MED FILL — LEVOTHYROXINE SODIUM 100 MC: 100 | 30 days supply | Qty: 30 | Fill #0

## 2020-08-18 MED FILL — AMLODIPINE BESYLATE 5 MG TA: 5 | 30 days supply | Qty: 30 | Fill #4

## 2020-09-08 MED FILL — LEVOTHYROXINE SODIUM 100 MC: 100 | 30 days supply | Qty: 30 | Fill #1

## 2020-09-22 MED FILL — AMLODIPINE BESYLATE 5 MG TA: 5 | 30 days supply | Qty: 30 | Fill #5

## 2020-09-22 MED FILL — ALLOPURINOL 100 MG TABLET: 100 | 30 days supply | Qty: 30 | Fill #3

## 2020-10-13 MED FILL — AMLODIPINE BESYLATE 5 MG TA: 5 | 30 days supply | Qty: 30 | Fill #6

## 2020-10-13 MED FILL — METOPROLOL TARTRATE 25 MG T: 25 | 30 days supply | Qty: 60 | Fill #3

## 2020-11-10 ENCOUNTER — Other Ambulatory Visit: Payer: Self-pay

## 2020-11-10 MED FILL — Levothyroxine Sodium Tab 100 MCG: ORAL | 30 days supply | Qty: 30 | Fill #0 | Status: AC

## 2020-11-12 ENCOUNTER — Other Ambulatory Visit: Payer: Self-pay

## 2020-11-17 ENCOUNTER — Other Ambulatory Visit: Payer: Self-pay

## 2020-11-17 MED FILL — Amlodipine Besylate Tab 5 MG (Base Equivalent): ORAL | 30 days supply | Qty: 30 | Fill #0 | Status: CN

## 2020-11-18 ENCOUNTER — Other Ambulatory Visit: Payer: Self-pay

## 2020-11-19 ENCOUNTER — Other Ambulatory Visit: Payer: Self-pay

## 2020-12-08 ENCOUNTER — Other Ambulatory Visit: Payer: Self-pay

## 2020-12-08 MED FILL — Levothyroxine Sodium Tab 100 MCG: ORAL | 30 days supply | Qty: 30 | Fill #1 | Status: AC

## 2020-12-10 ENCOUNTER — Other Ambulatory Visit: Payer: Self-pay

## 2020-12-10 MED FILL — Amlodipine Besylate Tab 5 MG (Base Equivalent): ORAL | 30 days supply | Qty: 30 | Fill #0 | Status: CN

## 2020-12-15 ENCOUNTER — Other Ambulatory Visit: Payer: Self-pay

## 2020-12-15 MED FILL — Metoprolol Tartrate Tab 25 MG: ORAL | 30 days supply | Qty: 60 | Fill #0 | Status: AC

## 2020-12-15 MED FILL — Amlodipine Besylate Tab 5 MG (Base Equivalent): ORAL | 30 days supply | Qty: 30 | Fill #0 | Status: AC

## 2020-12-17 ENCOUNTER — Other Ambulatory Visit: Payer: Self-pay

## 2021-01-05 ENCOUNTER — Other Ambulatory Visit: Payer: Self-pay

## 2021-01-05 MED FILL — Levothyroxine Sodium Tab 100 MCG: ORAL | 30 days supply | Qty: 30 | Fill #2 | Status: AC

## 2021-01-09 ENCOUNTER — Other Ambulatory Visit: Payer: Self-pay

## 2021-01-19 MED FILL — Amlodipine Besylate Tab 5 MG (Base Equivalent): ORAL | 30 days supply | Qty: 30 | Fill #1 | Status: AC

## 2021-01-20 ENCOUNTER — Other Ambulatory Visit: Payer: Self-pay

## 2021-01-22 ENCOUNTER — Other Ambulatory Visit: Payer: Self-pay

## 2021-01-23 ENCOUNTER — Other Ambulatory Visit: Payer: Self-pay

## 2021-02-09 ENCOUNTER — Other Ambulatory Visit: Payer: Self-pay

## 2021-02-09 MED FILL — Levothyroxine Sodium Tab 100 MCG: ORAL | 30 days supply | Qty: 30 | Fill #3 | Status: AC

## 2021-02-19 ENCOUNTER — Other Ambulatory Visit: Payer: Self-pay

## 2021-02-19 MED FILL — Amlodipine Besylate Tab 5 MG (Base Equivalent): ORAL | 30 days supply | Qty: 30 | Fill #2 | Status: AC

## 2021-02-23 ENCOUNTER — Other Ambulatory Visit: Payer: Self-pay

## 2021-03-07 ENCOUNTER — Other Ambulatory Visit: Payer: Self-pay

## 2021-03-07 MED FILL — Levothyroxine Sodium Tab 100 MCG: ORAL | 30 days supply | Qty: 30 | Fill #4 | Status: AC

## 2021-03-09 ENCOUNTER — Other Ambulatory Visit: Payer: Self-pay

## 2021-03-10 ENCOUNTER — Other Ambulatory Visit: Payer: Self-pay

## 2021-03-20 ENCOUNTER — Other Ambulatory Visit: Payer: Self-pay

## 2021-03-20 MED FILL — Amlodipine Besylate Tab 5 MG (Base Equivalent): ORAL | 30 days supply | Qty: 30 | Fill #3 | Status: AC

## 2021-03-24 ENCOUNTER — Other Ambulatory Visit: Payer: Self-pay

## 2021-03-25 ENCOUNTER — Other Ambulatory Visit: Payer: Self-pay

## 2021-03-25 MED ORDER — METOPROLOL TARTRATE 25 MG PO TABS
ORAL_TABLET | ORAL | 6 refills | Status: AC
  Filled 2021-03-25: qty 60, 30d supply, fill #0
  Filled 2021-05-22: qty 60, 30d supply, fill #1
  Filled 2021-08-08: qty 60, 30d supply, fill #2
  Filled 2021-08-10: qty 60, 30d supply, fill #0

## 2021-03-26 ENCOUNTER — Other Ambulatory Visit: Payer: Self-pay

## 2021-04-01 ENCOUNTER — Other Ambulatory Visit: Payer: Self-pay

## 2021-04-07 ENCOUNTER — Other Ambulatory Visit: Payer: Self-pay

## 2021-04-07 MED ORDER — METFORMIN HCL 500 MG PO TABS
250.0000 mg | ORAL_TABLET | Freq: Two times a day (BID) | ORAL | 1 refills | Status: AC
  Filled 2021-04-07: qty 30, 30d supply, fill #0
  Filled 2021-06-24: qty 30, 30d supply, fill #1
  Filled 2021-08-08: qty 30, 30d supply, fill #2
  Filled 2021-08-10: qty 30, 30d supply, fill #0
  Filled 2021-09-22: qty 30, 30d supply, fill #1
  Filled 2021-10-27: qty 30, 30d supply, fill #2
  Filled 2021-12-07: qty 30, 30d supply, fill #3

## 2021-04-07 MED FILL — Levothyroxine Sodium Tab 100 MCG: ORAL | 30 days supply | Qty: 30 | Fill #5 | Status: AC

## 2021-04-13 ENCOUNTER — Other Ambulatory Visit: Payer: Self-pay

## 2021-04-23 ENCOUNTER — Other Ambulatory Visit: Payer: Self-pay

## 2021-04-27 ENCOUNTER — Other Ambulatory Visit: Payer: Self-pay

## 2021-04-28 ENCOUNTER — Other Ambulatory Visit: Payer: Self-pay

## 2021-04-28 MED FILL — Amlodipine Besylate Tab 5 MG (Base Equivalent): ORAL | 30 days supply | Qty: 30 | Fill #0 | Status: AC

## 2021-04-29 ENCOUNTER — Other Ambulatory Visit: Payer: Self-pay

## 2021-05-07 ENCOUNTER — Other Ambulatory Visit: Payer: Self-pay

## 2021-05-07 MED FILL — Levothyroxine Sodium Tab 100 MCG: ORAL | 30 days supply | Qty: 30 | Fill #6 | Status: AC

## 2021-05-11 ENCOUNTER — Other Ambulatory Visit: Payer: Self-pay

## 2021-05-22 ENCOUNTER — Other Ambulatory Visit: Payer: Self-pay

## 2021-05-22 MED FILL — Amlodipine Besylate Tab 5 MG (Base Equivalent): ORAL | 30 days supply | Qty: 30 | Fill #1 | Status: AC

## 2021-05-25 ENCOUNTER — Other Ambulatory Visit: Payer: Self-pay

## 2021-05-25 MED ORDER — ALLOPURINOL 100 MG PO TABS
ORAL_TABLET | ORAL | 6 refills | Status: AC
  Filled 2021-05-25 – 2021-09-23 (×2): qty 30, 30d supply, fill #0
  Filled 2021-12-25: qty 30, 30d supply, fill #1

## 2021-05-26 ENCOUNTER — Other Ambulatory Visit: Payer: Self-pay

## 2021-06-01 ENCOUNTER — Other Ambulatory Visit: Payer: Self-pay

## 2021-06-08 ENCOUNTER — Other Ambulatory Visit: Payer: Self-pay

## 2021-06-08 MED FILL — Levothyroxine Sodium Tab 100 MCG: ORAL | 30 days supply | Qty: 30 | Fill #7 | Status: AC

## 2021-06-12 ENCOUNTER — Other Ambulatory Visit: Payer: Self-pay

## 2021-06-24 ENCOUNTER — Other Ambulatory Visit: Payer: Self-pay

## 2021-06-24 MED FILL — Amlodipine Besylate Tab 5 MG (Base Equivalent): ORAL | 30 days supply | Qty: 30 | Fill #2 | Status: AC

## 2021-06-24 MED FILL — Levothyroxine Sodium Tab 100 MCG: ORAL | 30 days supply | Qty: 30 | Fill #8 | Status: AC

## 2021-06-26 ENCOUNTER — Other Ambulatory Visit: Payer: Self-pay

## 2021-07-13 ENCOUNTER — Other Ambulatory Visit: Payer: Self-pay

## 2021-07-20 MED FILL — Amlodipine Besylate Tab 5 MG (Base Equivalent): ORAL | 30 days supply | Qty: 30 | Fill #3 | Status: AC

## 2021-07-21 ENCOUNTER — Other Ambulatory Visit: Payer: Self-pay

## 2021-07-24 ENCOUNTER — Other Ambulatory Visit: Payer: Self-pay

## 2021-08-08 ENCOUNTER — Other Ambulatory Visit: Payer: Self-pay

## 2021-08-10 ENCOUNTER — Other Ambulatory Visit: Payer: Self-pay

## 2021-08-12 ENCOUNTER — Other Ambulatory Visit: Payer: Self-pay

## 2021-08-12 MED FILL — Amlodipine Besylate Tab 5 MG (Base Equivalent): ORAL | 30 days supply | Qty: 30 | Fill #0 | Status: AC

## 2021-08-13 ENCOUNTER — Other Ambulatory Visit: Payer: Self-pay

## 2021-08-18 ENCOUNTER — Other Ambulatory Visit: Payer: Self-pay

## 2021-08-19 ENCOUNTER — Other Ambulatory Visit: Payer: Self-pay

## 2021-08-19 MED ORDER — LEVOTHYROXINE SODIUM 100 MCG PO TABS
ORAL_TABLET | ORAL | 11 refills | Status: AC
  Filled 2021-08-19: qty 30, 30d supply, fill #0
  Filled 2021-09-14: qty 30, 30d supply, fill #1

## 2021-09-14 ENCOUNTER — Other Ambulatory Visit: Payer: Self-pay

## 2021-09-16 ENCOUNTER — Other Ambulatory Visit: Payer: Self-pay

## 2021-09-22 ENCOUNTER — Other Ambulatory Visit: Payer: Self-pay

## 2021-09-23 ENCOUNTER — Other Ambulatory Visit: Payer: Self-pay

## 2021-09-25 ENCOUNTER — Other Ambulatory Visit: Payer: Self-pay

## 2021-09-29 ENCOUNTER — Other Ambulatory Visit: Payer: Self-pay

## 2021-09-29 MED ORDER — LEVOTHYROXINE SODIUM 100 MCG PO TABS
100.0000 ug | ORAL_TABLET | Freq: Every day | ORAL | 12 refills | Status: DC
  Filled 2021-09-29: qty 30, 30d supply, fill #0
  Filled 2021-11-10: qty 30, 30d supply, fill #1
  Filled 2021-12-11: qty 30, 30d supply, fill #2
  Filled 2022-01-14: qty 30, 30d supply, fill #3
  Filled 2022-02-12: qty 30, 30d supply, fill #4
  Filled 2022-03-15: qty 30, 30d supply, fill #5
  Filled 2022-04-13: qty 30, 30d supply, fill #6
  Filled 2022-05-12: qty 30, 30d supply, fill #7
  Filled 2022-06-11: qty 30, 30d supply, fill #8
  Filled 2022-07-13: qty 30, 30d supply, fill #9
  Filled 2022-08-11: qty 30, 30d supply, fill #10
  Filled 2022-09-13: qty 30, 30d supply, fill #11

## 2021-09-29 MED ORDER — METOPROLOL TARTRATE 25 MG PO TABS
25.0000 mg | ORAL_TABLET | Freq: Two times a day (BID) | ORAL | 12 refills | Status: DC
  Filled 2021-09-29: qty 60, 30d supply, fill #0
  Filled 2021-12-25: qty 60, 30d supply, fill #1
  Filled 2022-03-15: qty 60, 30d supply, fill #2
  Filled 2022-05-24 (×2): qty 60, 30d supply, fill #3
  Filled 2022-08-11: qty 60, 30d supply, fill #4

## 2021-09-29 MED ORDER — AMLODIPINE BESYLATE 5 MG PO TABS
5.0000 mg | ORAL_TABLET | Freq: Every day | ORAL | 12 refills | Status: DC
  Filled 2021-09-29: qty 30, 30d supply, fill #0
  Filled 2021-10-27: qty 30, 30d supply, fill #1
  Filled 2021-11-28: qty 30, 30d supply, fill #2
  Filled 2021-12-25: qty 30, 30d supply, fill #3
  Filled 2022-01-22 – 2022-01-27 (×2): qty 30, 30d supply, fill #4
  Filled 2022-02-23: qty 30, 30d supply, fill #5
  Filled 2022-03-27: qty 30, 30d supply, fill #6
  Filled 2022-04-23 (×2): qty 30, 30d supply, fill #7
  Filled 2022-05-24: qty 30, 30d supply, fill #8
  Filled 2022-06-22: qty 90, 90d supply, fill #9
  Filled 2022-09-23: qty 30, 30d supply, fill #10

## 2021-09-30 ENCOUNTER — Other Ambulatory Visit: Payer: Self-pay

## 2021-10-27 ENCOUNTER — Other Ambulatory Visit: Payer: Self-pay

## 2021-11-10 ENCOUNTER — Other Ambulatory Visit: Payer: Self-pay

## 2021-11-11 ENCOUNTER — Other Ambulatory Visit: Payer: Self-pay

## 2021-11-30 ENCOUNTER — Other Ambulatory Visit: Payer: Self-pay

## 2021-12-07 ENCOUNTER — Other Ambulatory Visit: Payer: Self-pay

## 2021-12-11 ENCOUNTER — Other Ambulatory Visit: Payer: Self-pay

## 2021-12-14 ENCOUNTER — Other Ambulatory Visit: Payer: Self-pay

## 2021-12-25 ENCOUNTER — Other Ambulatory Visit: Payer: Self-pay

## 2022-01-14 ENCOUNTER — Other Ambulatory Visit: Payer: Self-pay

## 2022-01-22 ENCOUNTER — Other Ambulatory Visit: Payer: Self-pay

## 2022-01-27 ENCOUNTER — Other Ambulatory Visit: Payer: Self-pay

## 2022-01-28 ENCOUNTER — Other Ambulatory Visit: Payer: Self-pay

## 2022-02-01 ENCOUNTER — Other Ambulatory Visit: Payer: Self-pay

## 2022-02-04 ENCOUNTER — Other Ambulatory Visit: Payer: Self-pay

## 2022-02-04 MED ORDER — METFORMIN HCL 500 MG PO TABS
ORAL_TABLET | ORAL | 2 refills | Status: AC
  Filled 2022-02-04: qty 90, 90d supply, fill #0
  Filled 2022-06-03: qty 90, 90d supply, fill #1
  Filled 2022-10-14: qty 90, 90d supply, fill #2

## 2022-02-05 ENCOUNTER — Other Ambulatory Visit: Payer: Self-pay

## 2022-02-12 ENCOUNTER — Other Ambulatory Visit: Payer: Self-pay

## 2022-02-16 ENCOUNTER — Other Ambulatory Visit: Payer: Self-pay

## 2022-02-23 ENCOUNTER — Other Ambulatory Visit: Payer: Self-pay

## 2022-02-24 ENCOUNTER — Other Ambulatory Visit: Payer: Self-pay

## 2022-03-15 ENCOUNTER — Other Ambulatory Visit: Payer: Self-pay

## 2022-03-30 ENCOUNTER — Other Ambulatory Visit: Payer: Self-pay

## 2022-04-13 ENCOUNTER — Other Ambulatory Visit: Payer: Self-pay

## 2022-04-14 ENCOUNTER — Other Ambulatory Visit: Payer: Self-pay

## 2022-04-23 ENCOUNTER — Other Ambulatory Visit: Payer: Self-pay

## 2022-04-27 ENCOUNTER — Other Ambulatory Visit: Payer: Self-pay

## 2022-05-12 ENCOUNTER — Other Ambulatory Visit: Payer: Self-pay

## 2022-05-24 ENCOUNTER — Other Ambulatory Visit: Payer: Self-pay

## 2022-05-25 ENCOUNTER — Other Ambulatory Visit: Payer: Self-pay

## 2022-06-03 ENCOUNTER — Other Ambulatory Visit: Payer: Self-pay

## 2022-06-08 ENCOUNTER — Other Ambulatory Visit: Payer: Self-pay

## 2022-06-09 ENCOUNTER — Other Ambulatory Visit: Payer: Self-pay

## 2022-06-09 MED ORDER — ALLOPURINOL 100 MG PO TABS
100.0000 mg | ORAL_TABLET | Freq: Every day | ORAL | 6 refills | Status: AC
  Filled 2022-06-09: qty 30, 30d supply, fill #0
  Filled 2022-10-25: qty 30, 30d supply, fill #1

## 2022-06-10 ENCOUNTER — Other Ambulatory Visit: Payer: Self-pay

## 2022-06-11 ENCOUNTER — Other Ambulatory Visit: Payer: Self-pay

## 2022-06-14 ENCOUNTER — Other Ambulatory Visit: Payer: Self-pay

## 2022-06-22 ENCOUNTER — Other Ambulatory Visit: Payer: Self-pay

## 2022-07-13 ENCOUNTER — Other Ambulatory Visit: Payer: Self-pay

## 2022-07-13 MED ORDER — INFLUENZA VAC A&B SA ADJ QUAD 0.5 ML IM PRSY
0.5000 mL | PREFILLED_SYRINGE | Freq: Once | INTRAMUSCULAR | 0 refills | Status: AC
  Filled 2022-07-13: qty 0.5, 1d supply, fill #0

## 2022-08-11 ENCOUNTER — Other Ambulatory Visit: Payer: Self-pay

## 2022-08-13 ENCOUNTER — Other Ambulatory Visit: Payer: Self-pay

## 2022-09-13 ENCOUNTER — Other Ambulatory Visit: Payer: Self-pay

## 2022-09-15 ENCOUNTER — Other Ambulatory Visit: Payer: Self-pay

## 2022-09-23 ENCOUNTER — Other Ambulatory Visit: Payer: Self-pay

## 2022-09-24 ENCOUNTER — Other Ambulatory Visit: Payer: Self-pay

## 2022-10-14 ENCOUNTER — Other Ambulatory Visit: Payer: Self-pay

## 2022-10-18 ENCOUNTER — Other Ambulatory Visit: Payer: Self-pay

## 2022-10-18 MED ORDER — LEVOTHYROXINE SODIUM 100 MCG PO TABS
100.0000 ug | ORAL_TABLET | Freq: Every day | ORAL | 6 refills | Status: DC
  Filled 2022-10-18: qty 30, 30d supply, fill #0
  Filled 2022-11-15: qty 30, 30d supply, fill #1
  Filled 2022-12-15: qty 30, 30d supply, fill #2
  Filled 2022-12-22: qty 90, 90d supply, fill #2
  Filled 2023-03-18 (×2): qty 60, 60d supply, fill #3

## 2022-10-21 ENCOUNTER — Other Ambulatory Visit: Payer: Self-pay

## 2022-10-25 ENCOUNTER — Other Ambulatory Visit: Payer: Self-pay

## 2022-10-26 ENCOUNTER — Other Ambulatory Visit: Payer: Self-pay

## 2022-10-27 ENCOUNTER — Other Ambulatory Visit: Payer: Self-pay

## 2022-10-27 MED ORDER — AMLODIPINE BESYLATE 5 MG PO TABS
5.0000 mg | ORAL_TABLET | Freq: Every day | ORAL | 3 refills | Status: DC
  Filled 2022-10-27: qty 90, 90d supply, fill #0
  Filled 2023-01-25: qty 90, 90d supply, fill #1
  Filled 2023-04-26: qty 90, 90d supply, fill #2
  Filled 2023-07-19: qty 90, 90d supply, fill #3

## 2022-10-27 MED ORDER — ALLOPURINOL 100 MG PO TABS
100.0000 mg | ORAL_TABLET | Freq: Every day | ORAL | 3 refills | Status: AC
  Filled 2022-10-27: qty 90, 90d supply, fill #0
  Filled 2022-12-22: qty 90, 90d supply, fill #1
  Filled 2023-08-25: qty 90, 90d supply, fill #2

## 2022-10-27 MED ORDER — LEVOTHYROXINE SODIUM 100 MCG PO TABS: ORAL_TABLET | ORAL | 3 refills | Status: AC

## 2022-10-27 MED ORDER — METOPROLOL TARTRATE 25 MG PO TABS
25.0000 mg | ORAL_TABLET | Freq: Two times a day (BID) | ORAL | 3 refills | Status: DC
  Filled 2022-10-27: qty 180, 90d supply, fill #0
  Filled 2022-12-22: qty 180, 90d supply, fill #1
  Filled 2023-05-17: qty 180, 90d supply, fill #2
  Filled 2023-08-25: qty 180, 90d supply, fill #3

## 2022-10-27 MED ORDER — METFORMIN HCL 500 MG PO TABS
250.0000 mg | ORAL_TABLET | Freq: Two times a day (BID) | ORAL | 3 refills | Status: DC
  Filled 2022-10-27: qty 90, 90d supply, fill #0
  Filled 2023-02-07 – 2023-02-18 (×2): qty 90, 90d supply, fill #1
  Filled 2023-06-20: qty 90, 90d supply, fill #2
  Filled 2023-10-18: qty 90, 90d supply, fill #3

## 2022-11-15 ENCOUNTER — Other Ambulatory Visit: Payer: Self-pay

## 2022-11-17 ENCOUNTER — Other Ambulatory Visit: Payer: Self-pay

## 2022-12-15 ENCOUNTER — Other Ambulatory Visit: Payer: Self-pay

## 2022-12-22 ENCOUNTER — Other Ambulatory Visit: Payer: Self-pay

## 2023-01-25 ENCOUNTER — Other Ambulatory Visit: Payer: Self-pay

## 2023-01-26 ENCOUNTER — Other Ambulatory Visit: Payer: Self-pay

## 2023-02-07 ENCOUNTER — Other Ambulatory Visit: Payer: Self-pay

## 2023-02-14 ENCOUNTER — Other Ambulatory Visit: Payer: Self-pay

## 2023-02-17 ENCOUNTER — Other Ambulatory Visit: Payer: Self-pay

## 2023-02-18 ENCOUNTER — Other Ambulatory Visit: Payer: Self-pay

## 2023-02-18 MED ORDER — METOPROLOL TARTRATE 25 MG PO TABS
25.0000 mg | ORAL_TABLET | Freq: Two times a day (BID) | ORAL | 3 refills | Status: AC
  Filled 2023-02-18: qty 180, 90d supply, fill #0

## 2023-02-18 MED ORDER — ALLOPURINOL 100 MG PO TABS
100.0000 mg | ORAL_TABLET | Freq: Every day | ORAL | 3 refills | Status: AC
  Filled 2023-02-18: qty 90, 90d supply, fill #0

## 2023-02-21 ENCOUNTER — Other Ambulatory Visit: Payer: Self-pay

## 2023-03-18 ENCOUNTER — Other Ambulatory Visit: Payer: Self-pay

## 2023-03-23 ENCOUNTER — Other Ambulatory Visit: Payer: Self-pay

## 2023-04-26 ENCOUNTER — Other Ambulatory Visit: Payer: Self-pay

## 2023-04-27 ENCOUNTER — Other Ambulatory Visit: Payer: Self-pay

## 2023-05-17 ENCOUNTER — Other Ambulatory Visit: Payer: Self-pay

## 2023-05-18 ENCOUNTER — Other Ambulatory Visit: Payer: Self-pay

## 2023-05-20 ENCOUNTER — Other Ambulatory Visit: Payer: Self-pay

## 2023-05-21 MED ORDER — LEVOTHYROXINE SODIUM 100 MCG PO TABS
100.0000 ug | ORAL_TABLET | Freq: Every day | ORAL | 6 refills | Status: DC
  Filled 2023-05-21: qty 30, 30d supply, fill #0
  Filled 2023-06-20: qty 30, 30d supply, fill #1
  Filled 2023-07-19: qty 30, 30d supply, fill #2
  Filled 2023-08-20 – 2023-08-25 (×2): qty 30, 30d supply, fill #3
  Filled 2023-09-21: qty 30, 30d supply, fill #4
  Filled 2023-10-20: qty 30, 30d supply, fill #5
  Filled 2023-11-22: qty 30, 30d supply, fill #6

## 2023-05-23 ENCOUNTER — Other Ambulatory Visit: Payer: Self-pay

## 2023-05-25 ENCOUNTER — Other Ambulatory Visit: Payer: Self-pay

## 2023-05-25 MED ORDER — INFLUENZA VAC A&B SURF ANT ADJ 0.5 ML IM SUSY
0.5000 mL | PREFILLED_SYRINGE | Freq: Once | INTRAMUSCULAR | 0 refills | Status: AC
  Filled 2023-05-25 – 2023-06-01 (×2): qty 0.5, 1d supply, fill #0

## 2023-06-01 ENCOUNTER — Other Ambulatory Visit: Payer: Self-pay

## 2023-06-22 ENCOUNTER — Other Ambulatory Visit: Payer: Self-pay

## 2023-07-22 ENCOUNTER — Other Ambulatory Visit: Payer: Self-pay

## 2023-08-25 ENCOUNTER — Other Ambulatory Visit: Payer: Self-pay

## 2023-09-21 ENCOUNTER — Other Ambulatory Visit: Payer: Self-pay

## 2023-09-21 MED ORDER — AMLODIPINE BESYLATE 5 MG PO TABS
5.0000 mg | ORAL_TABLET | Freq: Every day | ORAL | 3 refills | Status: AC
  Filled 2023-10-18: qty 90, 90d supply, fill #0
  Filled 2024-01-18: qty 90, 90d supply, fill #1
  Filled 2024-04-19: qty 90, 90d supply, fill #2
  Filled 2024-07-21: qty 90, 90d supply, fill #3

## 2023-10-19 ENCOUNTER — Other Ambulatory Visit: Payer: Self-pay

## 2023-10-20 ENCOUNTER — Other Ambulatory Visit: Payer: Self-pay

## 2023-10-24 ENCOUNTER — Other Ambulatory Visit: Payer: Self-pay

## 2023-11-23 ENCOUNTER — Other Ambulatory Visit: Payer: Self-pay

## 2023-12-20 ENCOUNTER — Other Ambulatory Visit: Payer: Self-pay

## 2023-12-21 ENCOUNTER — Other Ambulatory Visit: Payer: Self-pay

## 2023-12-23 ENCOUNTER — Other Ambulatory Visit: Payer: Self-pay

## 2023-12-23 MED ORDER — LEVOTHYROXINE SODIUM 100 MCG PO TABS
100.0000 ug | ORAL_TABLET | Freq: Every day | ORAL | 6 refills | Status: DC
  Filled 2023-12-23: qty 30, 30d supply, fill #0
  Filled 2024-01-24: qty 30, 30d supply, fill #1
  Filled 2024-02-21: qty 30, 30d supply, fill #2
  Filled 2024-03-21: qty 30, 30d supply, fill #3
  Filled 2024-04-25: qty 30, 30d supply, fill #4
  Filled 2024-04-30 (×2): qty 30, 30d supply, fill #5
  Filled 2024-06-26: qty 30, 30d supply, fill #6

## 2023-12-26 ENCOUNTER — Other Ambulatory Visit: Payer: Self-pay

## 2023-12-27 ENCOUNTER — Other Ambulatory Visit: Payer: Self-pay

## 2024-01-31 ENCOUNTER — Other Ambulatory Visit: Payer: Self-pay

## 2024-01-31 MED ORDER — METFORMIN HCL 500 MG PO TABS
ORAL_TABLET | ORAL | 2 refills | Status: AC
  Filled 2024-01-31: qty 180, 90d supply, fill #0
  Filled 2024-04-30: qty 180, 90d supply, fill #1

## 2024-02-01 ENCOUNTER — Other Ambulatory Visit: Payer: Self-pay

## 2024-02-24 ENCOUNTER — Other Ambulatory Visit: Payer: Self-pay

## 2024-03-28 ENCOUNTER — Other Ambulatory Visit: Payer: Self-pay

## 2024-04-23 ENCOUNTER — Other Ambulatory Visit: Payer: Self-pay

## 2024-04-27 ENCOUNTER — Other Ambulatory Visit: Payer: Self-pay

## 2024-04-30 ENCOUNTER — Other Ambulatory Visit: Payer: Self-pay

## 2024-05-01 ENCOUNTER — Other Ambulatory Visit: Payer: Self-pay

## 2024-05-16 ENCOUNTER — Other Ambulatory Visit: Payer: Self-pay

## 2024-05-16 MED ORDER — FLUZONE HIGH-DOSE 0.5 ML IM SUSY
0.5000 mL | PREFILLED_SYRINGE | Freq: Once | INTRAMUSCULAR | 0 refills | Status: AC
  Filled 2024-05-16: qty 0.5, 1d supply, fill #0

## 2024-05-28 ENCOUNTER — Other Ambulatory Visit: Payer: Self-pay

## 2024-05-28 MED ORDER — ISOSORBIDE MONONITRATE ER 30 MG PO TB24
30.0000 mg | ORAL_TABLET | Freq: Every day | ORAL | 3 refills | Status: AC
  Filled 2024-05-28: qty 30, 30d supply, fill #0

## 2024-05-29 ENCOUNTER — Other Ambulatory Visit: Payer: Self-pay

## 2024-06-05 ENCOUNTER — Other Ambulatory Visit: Payer: Self-pay

## 2024-06-06 ENCOUNTER — Other Ambulatory Visit: Payer: Self-pay

## 2024-06-06 MED ORDER — METOPROLOL TARTRATE 25 MG PO TABS
25.0000 mg | ORAL_TABLET | Freq: Two times a day (BID) | ORAL | 3 refills | Status: AC
  Filled 2024-06-06: qty 180, 90d supply, fill #0

## 2024-06-26 ENCOUNTER — Other Ambulatory Visit: Payer: Self-pay

## 2024-07-25 ENCOUNTER — Other Ambulatory Visit: Payer: Self-pay

## 2024-07-27 ENCOUNTER — Other Ambulatory Visit: Payer: Self-pay

## 2024-07-27 MED ORDER — LEVOTHYROXINE SODIUM 100 MCG PO TABS
100.0000 ug | ORAL_TABLET | Freq: Every day | ORAL | 6 refills | Status: AC
  Filled 2024-07-27: qty 30, 30d supply, fill #0
  Filled 2024-08-22: qty 30, 30d supply, fill #1

## 2024-08-22 ENCOUNTER — Other Ambulatory Visit: Payer: Self-pay

## 2024-08-23 ENCOUNTER — Other Ambulatory Visit: Payer: Self-pay
